# Patient Record
Sex: Female | Born: 1950 | Race: White | Hispanic: No | Marital: Married | Smoking: Former smoker
Health system: Southern US, Community
[De-identification: ages and names within clinical notes are randomized; demographics above are authoritative.]

## PROBLEM LIST (undated history)

## (undated) DIAGNOSIS — C801 Malignant (primary) neoplasm, unspecified: Secondary | ICD-10-CM

## (undated) DIAGNOSIS — T4145XA Adverse effect of unspecified anesthetic, initial encounter: Secondary | ICD-10-CM

## (undated) DIAGNOSIS — T8859XA Other complications of anesthesia, initial encounter: Secondary | ICD-10-CM

## (undated) DIAGNOSIS — F32A Depression, unspecified: Secondary | ICD-10-CM

## (undated) DIAGNOSIS — E039 Hypothyroidism, unspecified: Secondary | ICD-10-CM

## (undated) DIAGNOSIS — H04123 Dry eye syndrome of bilateral lacrimal glands: Secondary | ICD-10-CM

## (undated) DIAGNOSIS — F329 Major depressive disorder, single episode, unspecified: Secondary | ICD-10-CM

## (undated) DIAGNOSIS — Z87442 Personal history of urinary calculi: Secondary | ICD-10-CM

## (undated) DIAGNOSIS — G4730 Sleep apnea, unspecified: Secondary | ICD-10-CM

## (undated) DIAGNOSIS — J302 Other seasonal allergic rhinitis: Secondary | ICD-10-CM

## (undated) HISTORY — PX: COLONOSCOPY: SHX174

## (undated) HISTORY — PX: TONSILLECTOMY: SUR1361

## (undated) HISTORY — PX: ANAL FISSURE REPAIR: SHX2312

## (undated) HISTORY — PX: BLADDER STONE REMOVAL: SHX568

---

## 1999-09-03 ENCOUNTER — Other Ambulatory Visit: Admission: RE | Admit: 1999-09-03 | Discharge: 1999-09-03 | Payer: Self-pay | Admitting: Obstetrics and Gynecology

## 2000-10-24 ENCOUNTER — Encounter: Payer: Self-pay | Admitting: Obstetrics and Gynecology

## 2000-10-24 ENCOUNTER — Encounter: Admission: RE | Admit: 2000-10-24 | Discharge: 2000-10-24 | Payer: Self-pay | Admitting: Obstetrics and Gynecology

## 2000-11-17 ENCOUNTER — Other Ambulatory Visit: Admission: RE | Admit: 2000-11-17 | Discharge: 2000-11-17 | Payer: Self-pay | Admitting: Obstetrics and Gynecology

## 2001-10-26 ENCOUNTER — Encounter: Admission: RE | Admit: 2001-10-26 | Discharge: 2001-10-26 | Payer: Self-pay | Admitting: Obstetrics and Gynecology

## 2001-10-26 ENCOUNTER — Encounter: Payer: Self-pay | Admitting: Obstetrics and Gynecology

## 2001-11-23 ENCOUNTER — Other Ambulatory Visit: Admission: RE | Admit: 2001-11-23 | Discharge: 2001-11-23 | Payer: Self-pay | Admitting: Obstetrics and Gynecology

## 2002-08-30 ENCOUNTER — Encounter: Payer: Self-pay | Admitting: Geriatric Medicine

## 2002-08-30 ENCOUNTER — Encounter: Admission: RE | Admit: 2002-08-30 | Discharge: 2002-08-30 | Payer: Self-pay | Admitting: Geriatric Medicine

## 2002-09-02 ENCOUNTER — Encounter: Payer: Self-pay | Admitting: Geriatric Medicine

## 2002-09-02 ENCOUNTER — Encounter: Admission: RE | Admit: 2002-09-02 | Discharge: 2002-09-02 | Payer: Self-pay | Admitting: Geriatric Medicine

## 2002-09-23 ENCOUNTER — Ambulatory Visit (HOSPITAL_COMMUNITY): Admission: RE | Admit: 2002-09-23 | Discharge: 2002-09-23 | Payer: Self-pay | Admitting: Gastroenterology

## 2002-09-23 ENCOUNTER — Encounter (INDEPENDENT_AMBULATORY_CARE_PROVIDER_SITE_OTHER): Payer: Self-pay | Admitting: Specialist

## 2002-12-08 ENCOUNTER — Other Ambulatory Visit: Admission: RE | Admit: 2002-12-08 | Discharge: 2002-12-08 | Payer: Self-pay | Admitting: Obstetrics and Gynecology

## 2002-12-20 ENCOUNTER — Encounter: Admission: RE | Admit: 2002-12-20 | Discharge: 2002-12-20 | Payer: Self-pay | Admitting: Obstetrics and Gynecology

## 2002-12-20 ENCOUNTER — Encounter: Payer: Self-pay | Admitting: Obstetrics and Gynecology

## 2003-04-20 ENCOUNTER — Ambulatory Visit (HOSPITAL_BASED_OUTPATIENT_CLINIC_OR_DEPARTMENT_OTHER): Admission: RE | Admit: 2003-04-20 | Discharge: 2003-04-20 | Payer: Self-pay | Admitting: Geriatric Medicine

## 2003-12-13 ENCOUNTER — Other Ambulatory Visit: Admission: RE | Admit: 2003-12-13 | Discharge: 2003-12-13 | Payer: Self-pay | Admitting: Obstetrics and Gynecology

## 2004-01-10 ENCOUNTER — Ambulatory Visit (HOSPITAL_COMMUNITY): Admission: RE | Admit: 2004-01-10 | Discharge: 2004-01-10 | Payer: Self-pay | Admitting: Obstetrics and Gynecology

## 2005-01-14 ENCOUNTER — Ambulatory Visit (HOSPITAL_COMMUNITY): Admission: RE | Admit: 2005-01-14 | Discharge: 2005-01-14 | Payer: Self-pay | Admitting: Obstetrics and Gynecology

## 2005-01-21 ENCOUNTER — Other Ambulatory Visit: Admission: RE | Admit: 2005-01-21 | Discharge: 2005-01-21 | Payer: Self-pay | Admitting: Obstetrics and Gynecology

## 2005-03-14 ENCOUNTER — Ambulatory Visit (HOSPITAL_COMMUNITY): Admission: RE | Admit: 2005-03-14 | Discharge: 2005-03-14 | Payer: Self-pay | Admitting: Allergy

## 2005-05-22 ENCOUNTER — Other Ambulatory Visit: Admission: RE | Admit: 2005-05-22 | Discharge: 2005-05-22 | Payer: Self-pay | Admitting: Obstetrics and Gynecology

## 2005-09-16 ENCOUNTER — Other Ambulatory Visit: Admission: RE | Admit: 2005-09-16 | Discharge: 2005-09-16 | Payer: Self-pay | Admitting: Obstetrics and Gynecology

## 2005-12-16 ENCOUNTER — Other Ambulatory Visit: Admission: RE | Admit: 2005-12-16 | Discharge: 2005-12-16 | Payer: Self-pay | Admitting: Obstetrics and Gynecology

## 2006-02-11 ENCOUNTER — Other Ambulatory Visit: Admission: RE | Admit: 2006-02-11 | Discharge: 2006-02-11 | Payer: Self-pay | Admitting: Obstetrics and Gynecology

## 2006-03-19 ENCOUNTER — Ambulatory Visit (HOSPITAL_COMMUNITY): Admission: RE | Admit: 2006-03-19 | Discharge: 2006-03-19 | Payer: Self-pay | Admitting: Obstetrics and Gynecology

## 2007-02-25 ENCOUNTER — Other Ambulatory Visit: Admission: RE | Admit: 2007-02-25 | Discharge: 2007-02-25 | Payer: Self-pay | Admitting: Obstetrics and Gynecology

## 2007-04-17 ENCOUNTER — Ambulatory Visit (HOSPITAL_COMMUNITY): Admission: RE | Admit: 2007-04-17 | Discharge: 2007-04-17 | Payer: Self-pay | Admitting: Obstetrics and Gynecology

## 2007-06-01 ENCOUNTER — Encounter: Admission: RE | Admit: 2007-06-01 | Discharge: 2007-06-01 | Payer: Self-pay | Admitting: Geriatric Medicine

## 2007-06-09 ENCOUNTER — Encounter: Admission: RE | Admit: 2007-06-09 | Discharge: 2007-06-09 | Payer: Self-pay | Admitting: Geriatric Medicine

## 2007-06-11 ENCOUNTER — Emergency Department (HOSPITAL_COMMUNITY): Admission: EM | Admit: 2007-06-11 | Discharge: 2007-06-11 | Payer: Self-pay | Admitting: Emergency Medicine

## 2008-04-14 ENCOUNTER — Other Ambulatory Visit: Admission: RE | Admit: 2008-04-14 | Discharge: 2008-04-14 | Payer: Self-pay | Admitting: Obstetrics and Gynecology

## 2008-04-21 ENCOUNTER — Encounter: Admission: RE | Admit: 2008-04-21 | Discharge: 2008-04-21 | Payer: Self-pay | Admitting: Obstetrics and Gynecology

## 2010-02-06 HISTORY — PX: CARDIAC CATHETERIZATION: SHX172

## 2010-03-08 ENCOUNTER — Ambulatory Visit (HOSPITAL_COMMUNITY): Admission: RE | Admit: 2010-03-08 | Discharge: 2010-03-08 | Payer: Self-pay | Admitting: Cardiology

## 2010-07-02 ENCOUNTER — Encounter: Admission: RE | Admit: 2010-07-02 | Discharge: 2010-08-23 | Payer: Self-pay | Admitting: Family Medicine

## 2011-01-04 ENCOUNTER — Other Ambulatory Visit: Payer: Self-pay | Admitting: Obstetrics and Gynecology

## 2011-01-04 DIAGNOSIS — Z1239 Encounter for other screening for malignant neoplasm of breast: Secondary | ICD-10-CM

## 2011-01-10 ENCOUNTER — Ambulatory Visit
Admission: RE | Admit: 2011-01-10 | Discharge: 2011-01-10 | Disposition: A | Discharge status: Home / Self Care | Payer: Commercial Indemnity | Source: Ambulatory Visit | Attending: Obstetrics and Gynecology | Admitting: Obstetrics and Gynecology

## 2011-01-10 DIAGNOSIS — Z1239 Encounter for other screening for malignant neoplasm of breast: Secondary | ICD-10-CM

## 2011-02-15 ENCOUNTER — Other Ambulatory Visit (HOSPITAL_COMMUNITY)
Admission: RE | Admit: 2011-02-15 | Discharge: 2011-02-15 | Disposition: A | Discharge status: Home / Self Care | Payer: Commercial Indemnity | Source: Ambulatory Visit | Attending: Obstetrics and Gynecology | Admitting: Obstetrics and Gynecology

## 2011-02-15 ENCOUNTER — Other Ambulatory Visit: Payer: Self-pay | Admitting: Obstetrics and Gynecology

## 2011-02-15 DIAGNOSIS — Z01419 Encounter for gynecological examination (general) (routine) without abnormal findings: Secondary | ICD-10-CM | POA: Insufficient documentation

## 2011-04-26 NOTE — Op Note (Signed)
Donna Ponce, Donna Ponce                          ACCOUNT NO.:  [id number]   MEDICAL RECORD NO.:  [id number]                   PATIENT TYPE:  AMB   LOCATION:  ENDO                                 FACILITY:  [facility]   PHYSICIAN:  Danise Edge, M.D.                DATE OF BIRTH:  [birth year]   DATE OF PROCEDURE:  09/23/2002  DATE OF DISCHARGE:                                 OPERATIVE REPORT   PROCEDURE:  Colonoscopy and polypectomy.   INDICATIONS:  The patient is a 60 year old female born 09/03/1951.  When the  patient developed right lower quadrant abdominal pain, Hal T. Stoneking,  M.D., referred her to the [facility] for an air contrast  barium enema, which revealed a 1.5 cm polyp in the mid-ascending colon.  The  patient is scheduled to undergo colonoscopy with polypectomy today.   I discussed with the patient the complications associated with colonoscopy  and polypectomy, including a 15 per thousand risk of bleeding and four per  thousand risk of colon perforation requiring surgical repair.  The patient  has signed the operative permit.   ENDOSCOPIST:  Danise Edge, M.D.   PREMEDICATION:  Versed 10 mg, fentanyl 50 mcg.   ENDOSCOPE:  Olympus pediatric colonoscope.   DESCRIPTION OF PROCEDURE:  After obtaining informed consent, the patient was  placed in the left lateral decubitus position.  I administered intravenous  Versed and intravenous fentanyl to achieve conscious sedation for the  procedure.  The patient's blood pressure, oxygen saturation, and cardiac  rhythm were monitored throughout the procedure and documented in the medical  record.   Anal inspection was normal.  Digital rectal exam was normal.  The Olympus  pediatric video colonoscope was introduced into the rectum and advanced to  the cecum.  A normal-appearing ileocecal valve was intubated and the distal  ileum inspected.  Colonic preparation for the exam today was excellent.   Rectum  normal.   Sigmoid colon and descending colon:  At 35 cm from the anal verge a 2 mm  sessile polyp was removed with the hot biopsy forceps.   Splenic flexure normal.   Transverse colon normal.   Hepatic flexure normal.   Ascending colon:  From the mid-ascending colon a 1.5 cm pedunculated polyp  was removed with the electrocautery snare.   Cecum and ileocecal valve normal.   Distal ileum normal.   ASSESSMENT:  1. A 1.5 cm polyp was removed from the mid-ascending colon with the     electrocautery snare.  2. A 2 mm sessile polyp was removed from the distal sigmoid colon at 35 cm     from the anal verge with the hot biopsy forceps.  Danise Edge, M.D.    MJ/MEDQ  D:  09/23/2002  T:  09/23/2002  Job:  [id number]   cc:   Hal T. Stoneking, M.D.  [address] [place], [place] [address]  Fax: [phone]

## 2011-09-24 LAB — URINE MICROSCOPIC-ADD ON

## 2011-09-24 LAB — URINALYSIS, ROUTINE W REFLEX MICROSCOPIC
Glucose, UA: NEGATIVE
Nitrite: NEGATIVE
Protein, ur: NEGATIVE
pH: 7.5

## 2011-09-24 LAB — DIFFERENTIAL
Basophils Absolute: 0
Basophils Relative: 0
Lymphocytes Relative: 18
Lymphs Abs: 1.5
Monocytes Absolute: 0.7
Monocytes Relative: 8
Neutrophils Relative %: 74

## 2011-09-24 LAB — COMPREHENSIVE METABOLIC PANEL
AST: 20
Alkaline Phosphatase: 69
CO2: 29
Creatinine, Ser: 0.96
GFR calc Af Amer: >60
Glucose, Bld: 117 — ABNORMAL HIGH
Sodium: 140

## 2011-09-24 LAB — CBC
HCT: 38.1
Hemoglobin: 13.1
MCHC: 34.3
MCV: 92.8
WBC: 8.3

## 2011-09-24 LAB — LIPASE, BLOOD: Lipase: 28

## 2011-12-30 ENCOUNTER — Encounter (HOSPITAL_BASED_OUTPATIENT_CLINIC_OR_DEPARTMENT_OTHER): Payer: Self-pay | Admitting: *Deleted

## 2011-12-30 NOTE — Progress Notes (Signed)
No labs needed

## 2011-12-31 ENCOUNTER — Other Ambulatory Visit: Payer: Self-pay | Admitting: Orthopedic Surgery

## 2012-01-01 ENCOUNTER — Encounter (HOSPITAL_BASED_OUTPATIENT_CLINIC_OR_DEPARTMENT_OTHER)
Admission: RE | Disposition: A | Discharge status: Home / Self Care | Payer: Self-pay | Source: Ambulatory Visit | Attending: Orthopedic Surgery

## 2012-01-01 ENCOUNTER — Encounter (HOSPITAL_BASED_OUTPATIENT_CLINIC_OR_DEPARTMENT_OTHER): Payer: Self-pay | Admitting: *Deleted

## 2012-01-01 ENCOUNTER — Encounter (HOSPITAL_BASED_OUTPATIENT_CLINIC_OR_DEPARTMENT_OTHER): Payer: Self-pay | Admitting: Anesthesiology

## 2012-01-01 ENCOUNTER — Ambulatory Visit (HOSPITAL_BASED_OUTPATIENT_CLINIC_OR_DEPARTMENT_OTHER)
Admission: RE | Admit: 2012-01-01 | Discharge: 2012-01-01 | Disposition: A | Discharge status: Home / Self Care | Payer: BC Managed Care – PPO | Source: Ambulatory Visit | Attending: Orthopedic Surgery | Admitting: Orthopedic Surgery

## 2012-01-01 ENCOUNTER — Ambulatory Visit (HOSPITAL_BASED_OUTPATIENT_CLINIC_OR_DEPARTMENT_OTHER): Payer: BC Managed Care – PPO | Admitting: Anesthesiology

## 2012-01-01 DIAGNOSIS — F3289 Other specified depressive episodes: Secondary | ICD-10-CM | POA: Insufficient documentation

## 2012-01-01 DIAGNOSIS — M21619 Bunion of unspecified foot: Secondary | ICD-10-CM | POA: Insufficient documentation

## 2012-01-01 DIAGNOSIS — G4730 Sleep apnea, unspecified: Secondary | ICD-10-CM | POA: Insufficient documentation

## 2012-01-01 DIAGNOSIS — F329 Major depressive disorder, single episode, unspecified: Secondary | ICD-10-CM | POA: Insufficient documentation

## 2012-01-01 DIAGNOSIS — M21611 Bunion of right foot: Secondary | ICD-10-CM

## 2012-01-01 DIAGNOSIS — M2010 Hallux valgus (acquired), unspecified foot: Secondary | ICD-10-CM | POA: Insufficient documentation

## 2012-01-01 DIAGNOSIS — E039 Hypothyroidism, unspecified: Secondary | ICD-10-CM | POA: Insufficient documentation

## 2012-01-01 HISTORY — DX: Depression, unspecified: F32.A

## 2012-01-01 HISTORY — DX: Major depressive disorder, single episode, unspecified: F32.9

## 2012-01-01 HISTORY — DX: Other seasonal allergic rhinitis: J30.2

## 2012-01-01 HISTORY — DX: Sleep apnea, unspecified: G47.30

## 2012-01-01 HISTORY — DX: Hypothyroidism, unspecified: E03.9

## 2012-01-01 HISTORY — PX: BUNIONECTOMY: SHX129

## 2012-01-01 SURGERY — BUNIONECTOMY
Anesthesia: General | Site: Foot | Laterality: Bilateral | Wound class: Clean

## 2012-01-01 MED ORDER — POVIDONE-IODINE 7.5 % EX SOLN: Freq: Once | CUTANEOUS | Status: DC

## 2012-01-01 MED ORDER — PROPOFOL 10 MG/ML IV EMUL
INTRAVENOUS | Status: DC | PRN
  Administered 2012-01-01: 150 mg via INTRAVENOUS

## 2012-01-01 MED ORDER — MIDAZOLAM HCL 5 MG/5ML IJ SOLN
INTRAMUSCULAR | Status: DC | PRN
  Administered 2012-01-01: 2 mg via INTRAVENOUS

## 2012-01-01 MED ORDER — HYDROMORPHONE HCL PF 1 MG/ML IJ SOLN
0.2500 mg | INTRAMUSCULAR | Status: DC | PRN
  Administered 2012-01-01 (×2): 0.5 mg via INTRAVENOUS

## 2012-01-01 MED ORDER — OXYCODONE-ACETAMINOPHEN 5-325 MG PO TABS: 1.0000 | ORAL_TABLET | Freq: Four times a day (QID) | ORAL | Status: AC | PRN

## 2012-01-01 MED ORDER — CEFAZOLIN SODIUM-DEXTROSE 2-3 GM-% IV SOLR
2.0000 g | INTRAVENOUS | Status: AC
Start: 2012-01-01 — End: 2012-01-01
  Administered 2012-01-01: 2 g via INTRAVENOUS

## 2012-01-01 MED ORDER — ONDANSETRON HCL 4 MG/2ML IJ SOLN
INTRAMUSCULAR | Status: DC | PRN
  Administered 2012-01-01: 4 mg via INTRAVENOUS

## 2012-01-01 MED ORDER — MEPERIDINE HCL 25 MG/ML IJ SOLN: 6.2500 mg | INTRAMUSCULAR | Status: DC | PRN

## 2012-01-01 MED ORDER — FENTANYL CITRATE 0.05 MG/ML IJ SOLN
INTRAMUSCULAR | Status: DC | PRN
  Administered 2012-01-01 (×2): 25 ug via INTRAVENOUS
  Administered 2012-01-01: 100 ug via INTRAVENOUS
  Administered 2012-01-01: 25 ug via INTRAVENOUS

## 2012-01-01 MED ORDER — GLYCOPYRROLATE 0.2 MG/ML IJ SOLN
INTRAMUSCULAR | Status: DC | PRN
  Administered 2012-01-01: 0.2 mg via INTRAVENOUS

## 2012-01-01 MED ORDER — MORPHINE SULFATE 4 MG/ML IJ SOLN: 0.0500 mg/kg | INTRAMUSCULAR | Status: DC | PRN

## 2012-01-01 MED ORDER — 0.9 % SODIUM CHLORIDE (POUR BTL) OPTIME
TOPICAL | Status: DC | PRN
  Administered 2012-01-01: 1000 mL

## 2012-01-01 MED ORDER — ONDANSETRON HCL 4 MG/2ML IJ SOLN: 4.0000 mg | Freq: Once | INTRAMUSCULAR | Status: DC | PRN

## 2012-01-01 MED ORDER — BUPIVACAINE HCL (PF) 0.5 % IJ SOLN
INTRAMUSCULAR | Status: DC | PRN
  Administered 2012-01-01 (×2): 10 mL

## 2012-01-01 MED ORDER — LACTATED RINGERS IV SOLN
INTRAVENOUS | Status: DC
  Administered 2012-01-01 (×3): via INTRAVENOUS

## 2012-01-01 MED ORDER — LIDOCAINE HCL (CARDIAC) 20 MG/ML IV SOLN
INTRAVENOUS | Status: DC | PRN
  Administered 2012-01-01: 100 mg via INTRAVENOUS

## 2012-01-01 SURGICAL SUPPLY — 72 items
BANDAGE CONFORM 2  STR LF (GAUZE/BANDAGES/DRESSINGS) ×4 IMPLANT
BLADE AVERAGE 25X9 (BLADE) ×1 IMPLANT
BLADE OSC/SAG .038X5.5 CUT EDG (BLADE) IMPLANT
BLADE SURG 15 STRL LF DISP TIS (BLADE) ×1 IMPLANT
BLADE SURG 15 STRL SS (BLADE) ×4
BNDG CMPR 9X4 STRL LF SNTH (GAUZE/BANDAGES/DRESSINGS) ×1
BNDG CMPR MD 5X2 ELC HKLP STRL (GAUZE/BANDAGES/DRESSINGS) ×2
BNDG ELASTIC 2 VLCR STRL LF (GAUZE/BANDAGES/DRESSINGS) ×4 IMPLANT
BNDG ESMARK 4X9 LF (GAUZE/BANDAGES/DRESSINGS) ×1 IMPLANT
CANISTER SUCTION 1200CC (MISCELLANEOUS) ×1 IMPLANT
CLOTH BEACON ORANGE TIMEOUT ST (SAFETY) ×2 IMPLANT
COVER MAYO STAND STRL (DRAPES) ×1 IMPLANT
COVER SURGICAL LIGHT HANDLE (MISCELLANEOUS) ×1 IMPLANT
COVER TABLE BACK 60X90 (DRAPES) ×2 IMPLANT
CUFF TOURNIQUET SINGLE 18IN (TOURNIQUET CUFF) ×2 IMPLANT
DECANTER SPIKE VIAL GLASS SM (MISCELLANEOUS) IMPLANT
DRAIN PENROSE 1/4X12 LTX STRL (WOUND CARE) IMPLANT
DRAPE EXTREMITY T 121X128X90 (DRAPE) ×2 IMPLANT
DRAPE OEC MINIVIEW 54X84 (DRAPES) ×1 IMPLANT
DRAPE U 20/CS (DRAPES) ×3 IMPLANT
DRAPE U-SHAPE 47X51 STRL (DRAPES) ×2 IMPLANT
DRSG EMULSION OIL 3X3 NADH (GAUZE/BANDAGES/DRESSINGS) ×1 IMPLANT
DURAPREP 26ML APPLICATOR (WOUND CARE) ×3 IMPLANT
ELECT REM PT RETURN 9FT ADLT (ELECTROSURGICAL) ×2
ELECTRODE REM PT RTRN 9FT ADLT (ELECTROSURGICAL) IMPLANT
GAUZE XEROFORM 1X8 LF (GAUZE/BANDAGES/DRESSINGS) IMPLANT
GLOVE BIOGEL PI IND STRL 8 (GLOVE) ×2 IMPLANT
GLOVE BIOGEL PI INDICATOR 8 (GLOVE) ×2
GLOVE ECLIPSE 7.5 STRL STRAW (GLOVE) ×4 IMPLANT
GOWN BRE IMP PREV XXLGXLNG (GOWN DISPOSABLE) ×2 IMPLANT
GOWN PREVENTION PLUS XLARGE (GOWN DISPOSABLE) ×3 IMPLANT
GOWN PREVENTION PLUS XXLARGE (GOWN DISPOSABLE) ×1 IMPLANT
KWIRE 4.0 X .045IN (WIRE) IMPLANT
NDL 1/2 CIR CATGUT .05X1.09 (NEEDLE) IMPLANT
NDL ADDISON D1/2 CIR (NEEDLE) IMPLANT
NDL HYPO 25X1 1.5 SAFETY (NEEDLE) IMPLANT
NEEDLE 1/2 CIR CATGUT .05X1.09 (NEEDLE) IMPLANT
NEEDLE ADDISON D1/2 CIR (NEEDLE) IMPLANT
NEEDLE HYPO 25X1 1.5 SAFETY (NEEDLE) ×2 IMPLANT
NS IRRIG 1000ML POUR BTL (IV SOLUTION) ×2 IMPLANT
Orthosorb Resorbable Pin ×1 IMPLANT
PACK BASIN DAY SURGERY FS (CUSTOM PROCEDURE TRAY) ×2 IMPLANT
PAD CAST 3X4 CTTN HI CHSV (CAST SUPPLIES) IMPLANT
PADDING CAST ABS 4INX4YD NS (CAST SUPPLIES)
PADDING CAST ABS COTTON 4X4 ST (CAST SUPPLIES) ×1 IMPLANT
PADDING CAST COTTON 3X4 STRL (CAST SUPPLIES)
PADDING UNDERCAST 2  STERILE (CAST SUPPLIES) ×3 IMPLANT
PENCIL BUTTON HOLSTER BLD 10FT (ELECTRODE) ×1 IMPLANT
SHEET MEDIUM DRAPE 40X70 STRL (DRAPES) IMPLANT
SPONGE GAUZE 2X2 8PLY STRL LF (GAUZE/BANDAGES/DRESSINGS) ×1 IMPLANT
SPONGE GAUZE 4X4 12PLY (GAUZE/BANDAGES/DRESSINGS) ×2 IMPLANT
STOCKINETTE 4X48 STRL (DRAPES) ×3 IMPLANT
SUCTION FRAZIER TIP 10 FR DISP (SUCTIONS) ×1 IMPLANT
SUT ETHIBOND 3-0 V-5 (SUTURE) ×1 IMPLANT
SUT ETHILON 3 0 PS 1 (SUTURE) IMPLANT
SUT ETHILON 4 0 PS 2 18 (SUTURE) ×2 IMPLANT
SUT FIBERWIRE 2-0 18 17.9 3/8 (SUTURE) ×4
SUT VIC AB 0 CT3 27 (SUTURE) ×1 IMPLANT
SUT VIC AB 2-0 PS2 27 (SUTURE) IMPLANT
SUT VIC AB 2-0 SH 27 (SUTURE)
SUT VIC AB 2-0 SH 27XBRD (SUTURE) IMPLANT
SUT VIC AB 3-0 FS2 27 (SUTURE) ×1 IMPLANT
SUT VIC AB 4-0 P-3 18XBRD (SUTURE) ×1 IMPLANT
SUT VIC AB 4-0 P3 18 (SUTURE)
SUTURE FIBERWR 2-0 18 17.9 3/8 (SUTURE) IMPLANT
SYR BULB 3OZ (MISCELLANEOUS) ×1 IMPLANT
SYR CONTROL 10ML LL (SYRINGE) ×1 IMPLANT
TOWEL OR 17X24 6PK STRL BLUE (TOWEL DISPOSABLE) ×2 IMPLANT
TOWEL OR NON WOVEN STRL DISP B (DISPOSABLE) ×2 IMPLANT
TUBE CONNECTING 20X1/4 (TUBING) ×1 IMPLANT
UNDERPAD 30X30 INCONTINENT (UNDERPADS AND DIAPERS) ×3 IMPLANT
WATER STERILE IRR 1000ML POUR (IV SOLUTION) ×1 IMPLANT

## 2012-01-01 NOTE — Anesthesia Preprocedure Evaluation (Signed)
Anesthesia Evaluation  Patient identified by MRN, date of birth, ID band Patient awake    Reviewed: Allergy & Precautions, H&P , NPO status , Patient's Chart, lab work & pertinent test results  Airway Mallampati: I TM Distance: >3 FB Neck ROM: Full    Dental   Pulmonary sleep apnea and Continuous Positive Airway Pressure Ventilation ,          Cardiovascular     Neuro/Psych    GI/Hepatic   Endo/Other    Renal/GU      Musculoskeletal   Abdominal   Peds  Hematology   Anesthesia Other Findings   Reproductive/Obstetrics                           Anesthesia Physical Anesthesia Plan  ASA: III  Anesthesia Plan: General   Post-op Pain Management:    Induction: Intravenous  Airway Management Planned: LMA  Additional Equipment:   Intra-op Plan:   Post-operative Plan: Extubation in OR  Informed Consent: I have reviewed the patients History and Physical, chart, labs and discussed the procedure including the risks, benefits and alternatives for the proposed anesthesia with the patient or authorized representative who has indicated his/her understanding and acceptance.     Plan Discussed with: CRNA and Surgeon  Anesthesia Plan Comments: (Negative cardiac stress test 02/2010. Asymptomatic since.)        Anesthesia Quick Evaluation

## 2012-01-01 NOTE — H&P (Signed)
PREOPERATIVE H&P  Chief Complaint: bilateral bunions  HPI: Donna Ponce is a 61 y.o. female who presents for evaluation of bilateral bunions. It has been present for many years and has been worsening. She has failed conservative measures. Pain is rated as severe.  Past Medical History  Diagnosis Date  . Seasonal allergies   . Hypothyroidism   . Chronic kidney disease     bladder  . Asthma     allergies-sinus  . Depression     off meds  . Sleep apnea     uses cpap   Past Surgical History  Procedure Date  . Cardiac catheterization 3/11    negative  . Colonoscopy   . Anal fissure repair   . Bladder stone removal   . Tonsillectomy    History   Social History  . Marital Status: Married    Spouse Name: N/A    Number of Children: N/A  . Years of Education: N/A   Social History Main Topics  . Smoking status: Former Smoker    Quit date: 12/29/1982  . Smokeless tobacco: None  . Alcohol Use: No  . Drug Use: No  . Sexually Active:    Other Topics Concern  . None   Social History Narrative  . None   History reviewed. No pertinent family history. No Known Allergies Prior to Admission medications   Medication Sig Start Date End Date Taking? Authorizing Provider  albuterol (PROVENTIL HFA;VENTOLIN HFA) 108 (90 BASE) MCG/ACT inhaler Inhale 2 puffs into the lungs every 6 (six) hours as needed.   Yes Historical Provider, MD  azelastine (ASTELIN) 137 MCG/SPRAY nasal spray Place 1 spray into the nose 2 (two) times daily. Use in each nostril as directed   Yes Historical Provider, MD  fexofenadine (ALLEGRA) 180 MG tablet Take 180 mg by mouth daily.   Yes Historical Provider, MD  levothyroxine (SYNTHROID, LEVOTHROID) 100 MCG tablet Take 100 mcg by mouth daily.   Yes Historical Provider, MD  Multiple Vitamin (MULTIVITAMIN) capsule Take 1 capsule by mouth daily.   Yes Historical Provider, MD  mometasone (NASONEX) 50 MCG/ACT nasal spray Place 2 sprays into the nose daily.     Historical Provider, MD     Positive ROS: none  All other systems have been reviewed and were otherwise negative with the exception of those mentioned in the HPI and as above.  Physical Exam: Filed Vitals:   01/01/12 0717  BP: 130/92  Pulse: 68  Temp: 98.2 F (36.8 C)  Resp: 16    General: Alert, no acute distress Cardiovascular: No pedal edema Respiratory: No cyanosis, no use of accessory musculature GI: No organomegaly, abdomen is soft and non-tender Skin: No lesions in the area of chief complaint Neurologic: Sensation intact distally Psychiatric: Patient is competent for consent with normal mood and affect Lymphatic: No axillary or cervical lymphadenopathy  MUSCULOSKELETAL: bilateral hallux valgus with painful rom.  + ttp over med eminence  Assessment/Plan: bilateral bunions Plan for Procedure(s): BUNIONECTOMY distal chevron L@R   The risks benefits and alternatives were discussed with the patient including but not limited to the risks of nonoperative treatment, versus surgical intervention including infection, bleeding, nerve injury, malunion, nonunion, hardware prominence, hardware failure, need for hardware removal, blood clots, cardiopulmonary complications, morbidity, mortality, among others, and they were willing to proceed.  Predicted outcome is good, although there will be at least a six to nine month expected recovery.  Jakarie Pember L, MD 01/01/2012 8:38 AM

## 2012-01-01 NOTE — Anesthesia Postprocedure Evaluation (Signed)
Anesthesia Post Note  Patient: Donna Ponce  Procedure(s) Performed:  Arbutus Leas - Left foot surgery completed at 0955.  Right foot surgery started at 0948.  Anesthesia type: general  Patient location: PACU  Post pain: Pain level controlled  Post assessment: Patient's Cardiovascular Status Stable  Last Vitals:  Filed Vitals:   01/01/12 1100  BP: 128/77  Pulse: 81  Temp:   Resp: 9    Post vital signs: Reviewed and stable  Level of consciousness: sedated  Complications: No apparent anesthesia complications

## 2012-01-01 NOTE — Brief Op Note (Signed)
01/01/2012  10:45 AM  PATIENT:  Zenaida Niece  61 y.o. female  PRE-OPERATIVE DIAGNOSIS:  Bilateral Bunions  POST-OPERATIVE DIAGNOSIS:  Bilateral Bunions  PROCEDURE:  Procedure(s): BUNIONECTOMY  SURGEON:  Surgeon(s): Harvie Junior, MD  PHYSICIAN ASSISTANT:   ASSISTANTS: bethune   ANESTHESIA:   general  EBL:  Total I/O In: 1300 [I.V.:1300] Out: -   BLOOD ADMINISTERED:none  DRAINS: none   LOCAL MEDICATIONS USED:  MARCAINE 10CC to each foot  SPECIMEN:  No Specimen  DISPOSITION OF SPECIMEN:  N/A  COUNTS:  YES  TOURNIQUET:   Total Tourniquet Time Documented: Calf (Left) - 52 minutes Calf (Right) - 55 minutes  DICTATION: .Other Dictation: Dictation Number [id number]  PLAN OF CARE: Discharge to home after PACU  PATIENT DISPOSITION:  PACU - hemodynamically stable.   Delay start of Pharmacological VTE agent (>24hrs) due to surgical blood loss or risk of bleeding:  {YES/NO/NOT APPLICABLE:20182

## 2012-01-01 NOTE — Anesthesia Procedure Notes (Signed)
Procedure Name: LMA Insertion Date/Time: 01/01/2012 8:51 AM Performed by: Jearld Shines Pre-anesthesia Checklist: Patient identified, Emergency Drugs available, Suction available and Patient being monitored Patient Re-evaluated:Patient Re-evaluated prior to inductionOxygen Delivery Method: Circle System Utilized Preoxygenation: Pre-oxygenation with 100% oxygen Intubation Type: IV induction Ventilation: Mask ventilation without difficulty LMA: LMA inserted LMA Size: 4.0 Number of attempts: 1 Placement Confirmation: positive ETCO2 and breath sounds checked- equal and bilateral Tube secured with: Tape Dental Injury: Teeth and Oropharynx as per pre-operative assessment

## 2012-01-01 NOTE — Transfer of Care (Signed)
Immediate Anesthesia Transfer of Care Note  Patient: Donna Ponce  Procedure(s) Performed:  Arbutus Leas - Left foot surgery completed at 0955.  Right foot surgery started at 0948.  Patient Location: PACU  Anesthesia Type: General  Level of Consciousness: sedated  Airway & Oxygen Therapy: Patient Spontanous Breathing and Patient connected to face mask oxygen  Post-op Assessment: Report given to PACU RN and Post -op Vital signs reviewed and stable  Post vital signs: Reviewed and stable  Complications: No apparent anesthesia complications

## 2012-01-02 ENCOUNTER — Encounter (HOSPITAL_BASED_OUTPATIENT_CLINIC_OR_DEPARTMENT_OTHER): Payer: Self-pay | Admitting: Orthopedic Surgery

## 2012-01-02 NOTE — Op Note (Signed)
NAMECOPPER, Donna NO.:  [id number]  MEDICAL RECORD NO.:  [id number]  LOCATION:                                 FACILITY:  PHYSICIAN:  Harvie Junior, M.D.        DATE OF BIRTH:  DATE OF PROCEDURE:  01/01/2012 DATE OF DISCHARGE:                              OPERATIVE REPORT   PREOPERATIVE DIAGNOSIS:  Bilateral bunion deformity, left greater than right.  POSTOPERATIVE DIAGNOSIS:  Bilateral bunion deformity, left greater than right.  PRINCIPAL PROCEDURES: 1. Left foot distal chevron osteotomy. 2. Left foot distal soft tissue realignment for correction of hallux     valgus deformity. 3. Right foot distal chevron osteotomy. 4. Right foot distal soft tissue realignment for correction of hallux     valgus deformity.  SURGEON:  Harvie Junior, MD  ASSISTANT:  Marshia Ly, PA  ANESTHESIA:  General.  BRIEF HISTORY:  Donna Ponce is a 61 year old female with long history of having severe bilateral bunion deformity.  We talked to her in the office for a prolonged period of time about options.  She had had conservative care for greater than a year with activity modification, anti-inflammatory medication, and because of continued complaints of deformity and pain, we discussed surgical intervention.  We discussed the parameters for distal soft tissue realignment and distal chevron osteotomy, and we felt that this was a reasonable choice for her.  She brought up the idea of doing bilateral and we were not in love with the idea just relative to the fact that it would be bilateral procedure but she did want a piggyback to recovery.  She understood that she could be in a wheelchair for a period of time, certainly would need a walker and that would be obviously in significant pain with doing bilateral and because of this, she was well informed and decided to have bilateral procedure performed.  She was taken to the operating room for this procedure.  PROCEDURE:   The patient was taken to the operating room.  After adequate anesthesia was obtained with general anesthetic, the patient was placed supine on the operating table.  Both legs were then prepped and draped in usual sterile fashion.  Following this, the left leg was exsanguinated, blood pressure tourniquet was inflated to 250 mmHg. Following this, a midline incision was made along the shaft of the metatarsal down over the proximal phalanx, subcutaneous tissue down the level of the capsule care being taken to identify and protect the skin nerves, the superficial nerves and veins.  Once this was completed, a horizontal incision was made in the capsule of about 6 mm separation just proximal to the metatarsophalangeal joint and then a parallel incision was made.  These were wide at the top and the bottom, care being taken to protect the nerves when this was done.  Once this was completed, the dorsomedial extension was made on the metatarsal and the medial eminence was identified.  The medial eminence was then resected in line with the metatarsal.  Underneath the skin, we dissected over into the first web space and I carefully released the adductor and did a waffling of the lateral  capsule.  This allowed for the great toe to come into a corrected position.  Once this was done, under direct vision, we made an ink mark in the center of the head and then made at 60-degree angle proximal and distal cuts and then these cuts were done with a saw blade and then the chevron was moved over laterally and the shaft moved medially.  We got about 5 mm of correction here.  Once this was done, a bioabsorbable pin was used to lock the osteotomy in place.  Fluoro images were taken multiple to make sure that we had in fact moved the osteotomy sufficiently and that the bio pin was in adequate position. The bio pin was placed, and the lateral prominence of bone was then removed.  Following this, we took a look at the  toe position, excellent toe position was achieved, and we then did a capsular repair with 2-0 FiberWire interrupted sutures x5 holding the hallux in about a 5-degree overcorrected position.  We then irrigated this wound thoroughly, closed the skin with nylon interrupted sutures, and put a toe spica splint on here and a compressive dressing.  Once this was done, the tourniquet was let down, and attention was turned to the right foot.  The right foot was then exsanguinated, blood pressure tourniquet was inflated to 250 mmHg.  Following this, a midline incision was made along the metatarsophalangeal joint, subcutaneous tissues down the level of the capsule care being taken to identify and protect the skin nerves and veins.  Once this was completed, the capsular incisions were made about 2 mm distal to the metatarsophalangeal joint and a parallel incision was made from here and then these were wired at the top and bottom.  At this point, a dorsomedial extension was made along the metatarsal and this was resected back and then the metatarsal from the sulcus was cut in line with the metatarsal under fluoroscopic imaging.  Once this was done, attention was turned to the subcutaneous dissection into the first web space where the lateral capsule was waffled and the adductor was released.  Once this was done, attention was turned back to the lateral side, where an ink mark was made in the center of the head and proximal and distal extensions were made at 60-degree angle, and a saw blade was used to make a distal chevron osteotomy.  This was moved laterally and the shaft medially to get 5 mm of translation.  This was pinned in place with bioabsorbable pin under fluoroscopic imaging, and once that was completed, the medial bone was then removed in line with the metatarsal. Once this was done, the capsule was held in a corrected position, the toe in 5 degrees of varus and the 5 interrupted 2-0  FiberWire stitches were used to hold the capsule in a corrected position.  The dorsomedial extension was then closed with Vicryl as it was on the opposite side. Following this, the wound was irrigated, suctioned dry, and the skin was closed with interrupted and running nylon stitches.  Sterile compressive dressing was applied as well as the toe spica splint holding the toe in varus and the patient was then taken to the recovery room where she was noted to be in a satisfactory condition.  A 10 mL of Marcaine was placed into the both toes for postoperative analgesia.  Marshia Ly assisted throughout the case, critical in his performance.  Multiple fluoroscopic images were taken, interpreted by me, intraoperatively.  Harvie Junior, M.D.     Donna Ponce  D:  01/01/2012  T:  01/02/2012  Job:  [id number]

## 2012-02-18 ENCOUNTER — Other Ambulatory Visit: Payer: Self-pay | Admitting: Obstetrics and Gynecology

## 2012-02-18 DIAGNOSIS — Z1231 Encounter for screening mammogram for malignant neoplasm of breast: Secondary | ICD-10-CM

## 2012-02-28 ENCOUNTER — Ambulatory Visit: Payer: BC Managed Care – PPO

## 2012-03-06 ENCOUNTER — Ambulatory Visit: Payer: BC Managed Care – PPO

## 2012-03-17 ENCOUNTER — Other Ambulatory Visit: Payer: Self-pay | Admitting: Obstetrics and Gynecology

## 2012-03-17 ENCOUNTER — Other Ambulatory Visit (HOSPITAL_COMMUNITY)
Admission: RE | Admit: 2012-03-17 | Discharge: 2012-03-17 | Disposition: A | Discharge status: Home / Self Care | Payer: BC Managed Care – PPO | Source: Ambulatory Visit | Attending: Obstetrics and Gynecology | Admitting: Obstetrics and Gynecology

## 2012-03-17 DIAGNOSIS — Z01419 Encounter for gynecological examination (general) (routine) without abnormal findings: Secondary | ICD-10-CM | POA: Insufficient documentation

## 2012-06-08 ENCOUNTER — Other Ambulatory Visit (HOSPITAL_COMMUNITY): Payer: Self-pay | Admitting: Obstetrics and Gynecology

## 2012-06-08 DIAGNOSIS — Z1231 Encounter for screening mammogram for malignant neoplasm of breast: Secondary | ICD-10-CM

## 2012-06-10 ENCOUNTER — Ambulatory Visit (HOSPITAL_COMMUNITY)
Admission: RE | Admit: 2012-06-10 | Discharge: 2012-06-10 | Disposition: A | Discharge status: Home / Self Care | Payer: BC Managed Care – PPO | Source: Ambulatory Visit | Attending: Obstetrics and Gynecology | Admitting: Obstetrics and Gynecology

## 2012-06-10 DIAGNOSIS — Z1231 Encounter for screening mammogram for malignant neoplasm of breast: Secondary | ICD-10-CM | POA: Insufficient documentation

## 2012-06-16 ENCOUNTER — Other Ambulatory Visit: Payer: Self-pay | Admitting: Obstetrics and Gynecology

## 2012-06-16 DIAGNOSIS — R928 Other abnormal and inconclusive findings on diagnostic imaging of breast: Secondary | ICD-10-CM

## 2012-06-18 ENCOUNTER — Ambulatory Visit
Admission: RE | Admit: 2012-06-18 | Discharge: 2012-06-18 | Disposition: A | Discharge status: Home / Self Care | Payer: BC Managed Care – PPO | Source: Ambulatory Visit | Attending: Obstetrics and Gynecology | Admitting: Obstetrics and Gynecology

## 2012-06-18 ENCOUNTER — Other Ambulatory Visit: Payer: Self-pay | Admitting: Obstetrics and Gynecology

## 2012-06-18 DIAGNOSIS — R921 Mammographic calcification found on diagnostic imaging of breast: Secondary | ICD-10-CM

## 2012-06-18 DIAGNOSIS — R928 Other abnormal and inconclusive findings on diagnostic imaging of breast: Secondary | ICD-10-CM

## 2012-07-02 ENCOUNTER — Other Ambulatory Visit: Payer: Self-pay | Admitting: Dermatology

## 2013-02-02 ENCOUNTER — Other Ambulatory Visit: Payer: Self-pay | Admitting: Obstetrics and Gynecology

## 2013-02-02 DIAGNOSIS — R921 Mammographic calcification found on diagnostic imaging of breast: Secondary | ICD-10-CM

## 2013-02-23 ENCOUNTER — Ambulatory Visit
Admission: RE | Admit: 2013-02-23 | Discharge: 2013-02-23 | Disposition: A | Discharge status: Home / Self Care | Payer: BC Managed Care – PPO | Source: Ambulatory Visit | Attending: Obstetrics and Gynecology | Admitting: Obstetrics and Gynecology

## 2013-03-17 ENCOUNTER — Other Ambulatory Visit: Payer: Self-pay | Admitting: Obstetrics and Gynecology

## 2013-03-17 ENCOUNTER — Other Ambulatory Visit (HOSPITAL_COMMUNITY)
Admission: RE | Admit: 2013-03-17 | Discharge: 2013-03-17 | Disposition: A | Discharge status: Home / Self Care | Payer: BC Managed Care – PPO | Source: Ambulatory Visit | Attending: Obstetrics and Gynecology | Admitting: Obstetrics and Gynecology

## 2013-03-17 DIAGNOSIS — Z01419 Encounter for gynecological examination (general) (routine) without abnormal findings: Secondary | ICD-10-CM | POA: Insufficient documentation

## 2013-03-17 DIAGNOSIS — Z1151 Encounter for screening for human papillomavirus (HPV): Secondary | ICD-10-CM | POA: Insufficient documentation

## 2013-08-24 ENCOUNTER — Other Ambulatory Visit: Payer: Self-pay | Admitting: Obstetrics and Gynecology

## 2013-08-24 DIAGNOSIS — R921 Mammographic calcification found on diagnostic imaging of breast: Secondary | ICD-10-CM

## 2013-09-13 ENCOUNTER — Ambulatory Visit
Admission: RE | Admit: 2013-09-13 | Discharge: 2013-09-13 | Disposition: A | Discharge status: Home / Self Care | Payer: BC Managed Care – PPO | Source: Ambulatory Visit | Attending: Obstetrics and Gynecology | Admitting: Obstetrics and Gynecology

## 2013-09-13 DIAGNOSIS — R921 Mammographic calcification found on diagnostic imaging of breast: Secondary | ICD-10-CM

## 2013-11-24 ENCOUNTER — Other Ambulatory Visit: Payer: Self-pay | Admitting: Geriatric Medicine

## 2013-11-24 DIAGNOSIS — R109 Unspecified abdominal pain: Secondary | ICD-10-CM

## 2013-11-25 ENCOUNTER — Ambulatory Visit
Admission: RE | Admit: 2013-11-25 | Discharge: 2013-11-25 | Disposition: A | Discharge status: Home / Self Care | Payer: BC Managed Care – PPO | Source: Ambulatory Visit | Attending: Geriatric Medicine | Admitting: Geriatric Medicine

## 2013-11-25 DIAGNOSIS — R109 Unspecified abdominal pain: Secondary | ICD-10-CM

## 2014-01-24 ENCOUNTER — Other Ambulatory Visit: Payer: Self-pay | Admitting: Gastroenterology

## 2014-01-24 DIAGNOSIS — R1013 Epigastric pain: Secondary | ICD-10-CM

## 2014-02-04 ENCOUNTER — Other Ambulatory Visit: Payer: BC Managed Care – PPO

## 2014-02-08 ENCOUNTER — Ambulatory Visit
Admission: RE | Admit: 2014-02-08 | Discharge: 2014-02-08 | Disposition: A | Discharge status: Home / Self Care | Payer: BC Managed Care – PPO | Source: Ambulatory Visit | Attending: Gastroenterology | Admitting: Gastroenterology

## 2014-02-08 ENCOUNTER — Other Ambulatory Visit: Payer: BC Managed Care – PPO

## 2014-02-08 DIAGNOSIS — R1013 Epigastric pain: Secondary | ICD-10-CM

## 2014-02-10 ENCOUNTER — Other Ambulatory Visit: Payer: BC Managed Care – PPO

## 2014-03-10 ENCOUNTER — Other Ambulatory Visit: Payer: Self-pay | Admitting: Gastroenterology

## 2014-05-17 ENCOUNTER — Other Ambulatory Visit: Payer: Self-pay | Admitting: Obstetrics and Gynecology

## 2014-05-17 ENCOUNTER — Other Ambulatory Visit (HOSPITAL_COMMUNITY)
Admission: RE | Admit: 2014-05-17 | Discharge: 2014-05-17 | Disposition: A | Discharge status: Home / Self Care | Payer: BC Managed Care – PPO | Source: Ambulatory Visit | Attending: Obstetrics and Gynecology | Admitting: Obstetrics and Gynecology

## 2014-05-17 DIAGNOSIS — Z01419 Encounter for gynecological examination (general) (routine) without abnormal findings: Secondary | ICD-10-CM | POA: Insufficient documentation

## 2014-05-18 LAB — CYTOLOGY - PAP

## 2014-09-26 ENCOUNTER — Other Ambulatory Visit: Payer: Self-pay | Admitting: Obstetrics and Gynecology

## 2014-09-26 DIAGNOSIS — R921 Mammographic calcification found on diagnostic imaging of breast: Secondary | ICD-10-CM

## 2014-10-07 ENCOUNTER — Other Ambulatory Visit: Payer: Self-pay | Admitting: Dermatology

## 2014-10-12 ENCOUNTER — Ambulatory Visit
Admission: RE | Admit: 2014-10-12 | Discharge: 2014-10-12 | Disposition: A | Discharge status: Home / Self Care | Payer: 59 | Source: Ambulatory Visit | Attending: Obstetrics and Gynecology | Admitting: Obstetrics and Gynecology

## 2014-10-12 ENCOUNTER — Encounter (INDEPENDENT_AMBULATORY_CARE_PROVIDER_SITE_OTHER): Payer: Self-pay

## 2014-10-12 DIAGNOSIS — R921 Mammographic calcification found on diagnostic imaging of breast: Secondary | ICD-10-CM

## 2015-01-20 ENCOUNTER — Other Ambulatory Visit: Payer: Self-pay | Admitting: Geriatric Medicine

## 2015-01-20 DIAGNOSIS — R1084 Generalized abdominal pain: Secondary | ICD-10-CM

## 2015-01-23 ENCOUNTER — Other Ambulatory Visit: Payer: Self-pay

## 2015-01-25 ENCOUNTER — Inpatient Hospital Stay
Admission: RE | Admit: 2015-01-25 | Discharge: 2015-01-25 | Disposition: A | Discharge status: Home / Self Care | Payer: Self-pay | Source: Ambulatory Visit | Attending: Geriatric Medicine | Admitting: Geriatric Medicine

## 2015-01-25 ENCOUNTER — Other Ambulatory Visit: Payer: Self-pay

## 2015-01-27 ENCOUNTER — Ambulatory Visit
Admission: RE | Admit: 2015-01-27 | Discharge: 2015-01-27 | Disposition: A | Discharge status: Home / Self Care | Payer: Self-pay | Source: Ambulatory Visit | Attending: Geriatric Medicine | Admitting: Geriatric Medicine

## 2015-01-27 DIAGNOSIS — R1084 Generalized abdominal pain: Secondary | ICD-10-CM

## 2015-01-27 MED ORDER — IOHEXOL 300 MG/ML  SOLN
100.0000 mL | Freq: Once | INTRAMUSCULAR | Status: AC | PRN
  Administered 2015-01-27: 100 mL via INTRAVENOUS

## 2015-05-03 ENCOUNTER — Other Ambulatory Visit: Payer: Self-pay | Admitting: Gastroenterology

## 2015-05-12 ENCOUNTER — Encounter (HOSPITAL_COMMUNITY): Payer: Self-pay | Admitting: *Deleted

## 2015-05-17 ENCOUNTER — Encounter (HOSPITAL_COMMUNITY): Payer: Self-pay | Admitting: Anesthesiology

## 2015-05-17 NOTE — Anesthesia Preprocedure Evaluation (Signed)
Anesthesia Evaluation  Patient identified by MRN, date of birth, ID band Patient awake    Reviewed: Allergy & Precautions, NPO status , Patient's Chart, lab work & pertinent test results  History of Anesthesia Complications (+) history of anesthetic complications  Airway Mallampati: II  TM Distance: >3 FB Neck ROM: Full    Dental no notable dental hx.    Pulmonary asthma , sleep apnea , former smoker,  breath sounds clear to auscultation  Pulmonary exam normal       Cardiovascular negative cardio ROS Normal cardiovascular examRhythm:Regular Rate:Normal     Neuro/Psych PSYCHIATRIC DISORDERS Depression negative neurological ROS     GI/Hepatic negative GI ROS, Neg liver ROS,   Endo/Other  Hypothyroidism   Renal/GU negative Renal ROS  negative genitourinary   Musculoskeletal negative musculoskeletal ROS (+)   Abdominal   Peds negative pediatric ROS (+)  Hematology negative hematology ROS (+)   Anesthesia Other Findings   Reproductive/Obstetrics negative OB ROS                             Anesthesia Physical Anesthesia Plan  ASA: II  Anesthesia Plan: MAC   Post-op Pain Management:    Induction: Intravenous  Airway Management Planned: Natural Airway  Additional Equipment:   Intra-op Plan:   Post-operative Plan:   Informed Consent: I have reviewed the patients History and Physical, chart, labs and discussed the procedure including the risks, benefits and alternatives for the proposed anesthesia with the patient or authorized representative who has indicated his/her understanding and acceptance.   Dental advisory given  Plan Discussed with: CRNA  Anesthesia Plan Comments:         Anesthesia Quick Evaluation

## 2015-05-18 ENCOUNTER — Ambulatory Visit (HOSPITAL_COMMUNITY): Payer: 59 | Admitting: Anesthesiology

## 2015-05-18 ENCOUNTER — Encounter (HOSPITAL_COMMUNITY): Payer: Self-pay | Admitting: Anesthesiology

## 2015-05-18 ENCOUNTER — Ambulatory Visit (HOSPITAL_COMMUNITY)
Admission: RE | Admit: 2015-05-18 | Discharge: 2015-05-18 | Disposition: A | Discharge status: Home / Self Care | Payer: 59 | Source: Ambulatory Visit | Attending: Gastroenterology | Admitting: Gastroenterology

## 2015-05-18 ENCOUNTER — Encounter (HOSPITAL_COMMUNITY)
Admission: RE | Disposition: A | Discharge status: Home / Self Care | Payer: Self-pay | Source: Ambulatory Visit | Attending: Gastroenterology

## 2015-05-18 DIAGNOSIS — G4733 Obstructive sleep apnea (adult) (pediatric): Secondary | ICD-10-CM | POA: Insufficient documentation

## 2015-05-18 DIAGNOSIS — Z09 Encounter for follow-up examination after completed treatment for conditions other than malignant neoplasm: Secondary | ICD-10-CM | POA: Diagnosis present

## 2015-05-18 DIAGNOSIS — Z87891 Personal history of nicotine dependence: Secondary | ICD-10-CM | POA: Insufficient documentation

## 2015-05-18 DIAGNOSIS — J45909 Unspecified asthma, uncomplicated: Secondary | ICD-10-CM | POA: Insufficient documentation

## 2015-05-18 DIAGNOSIS — G8929 Other chronic pain: Secondary | ICD-10-CM | POA: Diagnosis not present

## 2015-05-18 DIAGNOSIS — D123 Benign neoplasm of transverse colon: Secondary | ICD-10-CM | POA: Insufficient documentation

## 2015-05-18 DIAGNOSIS — Z86010 Personal history of colonic polyps: Secondary | ICD-10-CM | POA: Diagnosis not present

## 2015-05-18 DIAGNOSIS — E039 Hypothyroidism, unspecified: Secondary | ICD-10-CM | POA: Insufficient documentation

## 2015-05-18 HISTORY — DX: Dry eye syndrome of bilateral lacrimal glands: H04.123

## 2015-05-18 HISTORY — PX: COLONOSCOPY WITH PROPOFOL: SHX5780

## 2015-05-18 HISTORY — DX: Other complications of anesthesia, initial encounter: T88.59XA

## 2015-05-18 HISTORY — DX: Personal history of urinary calculi: Z87.442

## 2015-05-18 HISTORY — DX: Malignant (primary) neoplasm, unspecified: C80.1

## 2015-05-18 HISTORY — DX: Adverse effect of unspecified anesthetic, initial encounter: T41.45XA

## 2015-05-18 SURGERY — COLONOSCOPY WITH PROPOFOL
Anesthesia: Monitor Anesthesia Care

## 2015-05-18 MED ORDER — SODIUM CHLORIDE 0.9 % IV SOLN: INTRAVENOUS | Status: DC

## 2015-05-18 MED ORDER — PROPOFOL INFUSION 10 MG/ML OPTIME
INTRAVENOUS | Status: DC | PRN
  Administered 2015-05-18: 75 ug/kg/min via INTRAVENOUS

## 2015-05-18 MED ORDER — LACTATED RINGERS IV SOLN
INTRAVENOUS | Status: DC | PRN
  Administered 2015-05-18: 08:00:00 via INTRAVENOUS

## 2015-05-18 MED ORDER — PROPOFOL 10 MG/ML IV BOLUS
INTRAVENOUS | Status: AC
  Filled 2015-05-18: qty 20

## 2015-05-18 MED ORDER — LACTATED RINGERS IV SOLN: INTRAVENOUS | Status: DC

## 2015-05-18 MED ORDER — PROPOFOL 10 MG/ML IV BOLUS
INTRAVENOUS | Status: DC | PRN
  Administered 2015-05-18: 30 mg via INTRAVENOUS
  Administered 2015-05-18 (×2): 20 mg via INTRAVENOUS

## 2015-05-18 SURGICAL SUPPLY — 22 items

## 2015-05-18 NOTE — H&P (Signed)
  Procedure: Surveillance colonoscopy. Chronic upper abdominal discomfort. 01/27/2015 normal CT scan abdomen-pelvis. 03/10/2014 normal esophagogastroduodenoscopy; gastric biopsies negative for H. pylori gastritis. 02/08/2014 normal barium upper GI x-ray series. 01/26/2013 normal abdominal ultrasound. 09/24/2010 colonoscopy performed with removal of a 5 mm adenomatous colon polyp. 06/09/2007 CT scan abdomen and pelvis showed urinary bladder stone. 06/01/2007 normal barium upper GI x-ray series. 09/18/2005 normal colonoscopy.  History: The patient is a 64 year old female born in [birth year]. In October 2011 she underwent a colonoscopy with removal of a 5 mm adenomatous colon polyp. She underwent a normal colonoscopy in October 2006. She has unexplained chronic upper abdominal discomfort.  The patient is scheduled to undergo a surveillance colonoscopy today.  Past medical history: Obstructive sleep apnea syndrome. Urinary bladder stone. Major depression. Hypothyroidism. Lumbar scoliosis. Allergic rhinitis. Tonsillectomy. Anal fistula repair in 1977.  Allergies: Dust, pollen, mildew, grass  Exam: The patient is alert and lying comfortably on the endoscopy stretcher. Abdomen is soft and nontender to palpation. Lungs are clear to auscultation. Cardiac exam reveals a regular rhythm.  Plan: Proceed with surveillance colonoscopy

## 2015-05-18 NOTE — Transfer of Care (Signed)
Immediate Anesthesia Transfer of Care Note  Patient: Donna Ponce  Procedure(s) Performed: Procedure(s): COLONOSCOPY WITH PROPOFOL (N/A)  Patient Location: PACU  Anesthesia Type:MAC  Level of Consciousness:  sedated, patient cooperative and responds to stimulation  Airway & Oxygen Therapy:Patient Spontanous Breathing and Patient connected to face mask oxgen  Post-op Assessment:  Report given to PACU RN and Post -op Vital signs reviewed and stable  Post vital signs:  Reviewed and stable  Last Vitals:  Filed Vitals:   05/18/15 0718  BP: 121/78  Pulse: 58  Temp: 36.9 C  Resp: 8    Complications: No apparent anesthesia complications

## 2015-05-18 NOTE — Discharge Instructions (Signed)

## 2015-05-18 NOTE — Anesthesia Postprocedure Evaluation (Signed)
  Anesthesia Post-op Note  Patient: Donna Ponce  Procedure(s) Performed: Procedure(s) (LRB): COLONOSCOPY WITH PROPOFOL (N/A)  Patient Location: PACU  Anesthesia Type: MAC  Level of Consciousness: awake and alert   Airway and Oxygen Therapy: Patient Spontanous Breathing  Post-op Pain: mild  Post-op Assessment: Post-op Vital signs reviewed, Patient's Cardiovascular Status Stable, Respiratory Function Stable, Patent Airway and No signs of Nausea or vomiting  Last Vitals:  Filed Vitals:   05/18/15 0807  BP: 112/64  Pulse: 55  Temp: 37.2 C  Resp: 17    Post-op Vital Signs: stable   Complications: No apparent anesthesia complications

## 2015-05-18 NOTE — Op Note (Signed)
Procedure: Surveillance colonoscopy. 09/24/2010 colonoscopy performed with removal of a 5 mm adenomatous colon polyp. 09/18/2005 normal colonoscopy.  Endoscopist: Earle Gell  Premedication: Propofol administered by anesthesia  Procedure: The patient was placed in the left lateral decubitus position. Anal inspection and digital rectal exam were normal. The Pentax pediatric colonoscope was introduced into the rectum and advanced to the cecum. A normal-appearing appendiceal orifice and cecal valve were identified. Colonic preparation for the exam today was good. Withdrawal time was 9 minutes  Rectum. Normal. Retroflexed view of the distal rectum was normal.  Sigmoid colon and descending colon. Normal  Splenic flexure. Normal  Transverse colon. From the mid transverse colon, a 5 mm sessile polyp was removed with the cold snare.  Hepatic flexure. Normal  Ascending colon. Normal  Cecum and ileocecal valve. Normal  Assessment: A small polyp was removed from the mid transverse colon; otherwise normal surveillance colonoscopy  Recommendation: If the polyp returns adenomatous pathologically, schedule repeat surveillance colonoscopy in 5 years. If the polyp returns hyperplastic pathologically, schedule repeat surveillance colonoscopy in 10 years

## 2015-05-19 ENCOUNTER — Encounter (HOSPITAL_COMMUNITY): Payer: Self-pay | Admitting: Gastroenterology

## 2015-05-23 ENCOUNTER — Other Ambulatory Visit (HOSPITAL_COMMUNITY)
Admission: RE | Admit: 2015-05-23 | Discharge: 2015-05-23 | Disposition: A | Discharge status: Home / Self Care | Payer: 59 | Source: Ambulatory Visit | Attending: Obstetrics and Gynecology | Admitting: Obstetrics and Gynecology

## 2015-05-23 ENCOUNTER — Other Ambulatory Visit: Payer: Self-pay | Admitting: Obstetrics and Gynecology

## 2015-05-23 DIAGNOSIS — Z01419 Encounter for gynecological examination (general) (routine) without abnormal findings: Secondary | ICD-10-CM | POA: Insufficient documentation

## 2015-05-24 LAB — CYTOLOGY - PAP

## 2015-08-15 ENCOUNTER — Other Ambulatory Visit: Payer: Self-pay | Admitting: Geriatric Medicine

## 2015-08-15 ENCOUNTER — Ambulatory Visit
Admission: RE | Admit: 2015-08-15 | Discharge: 2015-08-15 | Disposition: A | Discharge status: Home / Self Care | Payer: 59 | Source: Ambulatory Visit | Attending: Geriatric Medicine | Admitting: Geriatric Medicine

## 2015-08-15 DIAGNOSIS — R0789 Other chest pain: Secondary | ICD-10-CM

## 2016-01-15 ENCOUNTER — Other Ambulatory Visit: Payer: Self-pay

## 2016-01-15 DIAGNOSIS — Z1231 Encounter for screening mammogram for malignant neoplasm of breast: Secondary | ICD-10-CM

## 2016-01-26 ENCOUNTER — Ambulatory Visit
Admission: RE | Admit: 2016-01-26 | Discharge: 2016-01-26 | Disposition: A | Discharge status: Home / Self Care | Payer: BLUE CROSS/BLUE SHIELD | Source: Ambulatory Visit

## 2016-01-26 DIAGNOSIS — Z1231 Encounter for screening mammogram for malignant neoplasm of breast: Secondary | ICD-10-CM

## 2016-10-21 ENCOUNTER — Other Ambulatory Visit: Payer: Self-pay | Admitting: Obstetrics and Gynecology

## 2016-10-21 ENCOUNTER — Other Ambulatory Visit (HOSPITAL_COMMUNITY)
Admission: RE | Admit: 2016-10-21 | Discharge: 2016-10-21 | Disposition: A | Discharge status: Home / Self Care | Payer: Commercial Managed Care - HMO | Source: Ambulatory Visit | Attending: Obstetrics and Gynecology | Admitting: Obstetrics and Gynecology

## 2016-10-21 DIAGNOSIS — Z1151 Encounter for screening for human papillomavirus (HPV): Secondary | ICD-10-CM | POA: Diagnosis not present

## 2016-10-21 DIAGNOSIS — Z9189 Other specified personal risk factors, not elsewhere classified: Secondary | ICD-10-CM | POA: Diagnosis not present

## 2016-10-21 DIAGNOSIS — B009 Herpesviral infection, unspecified: Secondary | ICD-10-CM | POA: Diagnosis not present

## 2016-10-21 DIAGNOSIS — Z780 Asymptomatic menopausal state: Secondary | ICD-10-CM | POA: Diagnosis not present

## 2016-10-21 DIAGNOSIS — Z01419 Encounter for gynecological examination (general) (routine) without abnormal findings: Secondary | ICD-10-CM | POA: Diagnosis not present

## 2016-10-24 LAB — CYTOLOGY - PAP
Diagnosis: NEGATIVE
HPV (WINDOPATH): NOT DETECTED

## 2016-10-25 DIAGNOSIS — Z23 Encounter for immunization: Secondary | ICD-10-CM | POA: Diagnosis not present

## 2016-10-25 DIAGNOSIS — M545 Low back pain: Secondary | ICD-10-CM | POA: Diagnosis not present

## 2016-10-25 DIAGNOSIS — F331 Major depressive disorder, recurrent, moderate: Secondary | ICD-10-CM | POA: Diagnosis not present

## 2016-11-05 DIAGNOSIS — R05 Cough: Secondary | ICD-10-CM | POA: Diagnosis not present

## 2016-11-05 DIAGNOSIS — J3081 Allergic rhinitis due to animal (cat) (dog) hair and dander: Secondary | ICD-10-CM | POA: Diagnosis not present

## 2016-11-05 DIAGNOSIS — J301 Allergic rhinitis due to pollen: Secondary | ICD-10-CM | POA: Diagnosis not present

## 2016-11-05 DIAGNOSIS — J3089 Other allergic rhinitis: Secondary | ICD-10-CM | POA: Diagnosis not present

## 2016-11-08 DIAGNOSIS — J301 Allergic rhinitis due to pollen: Secondary | ICD-10-CM | POA: Diagnosis not present

## 2016-11-08 DIAGNOSIS — J3081 Allergic rhinitis due to animal (cat) (dog) hair and dander: Secondary | ICD-10-CM | POA: Diagnosis not present

## 2016-11-08 DIAGNOSIS — J3089 Other allergic rhinitis: Secondary | ICD-10-CM | POA: Diagnosis not present

## 2016-11-11 DIAGNOSIS — M8588 Other specified disorders of bone density and structure, other site: Secondary | ICD-10-CM | POA: Diagnosis not present

## 2016-11-11 DIAGNOSIS — Z780 Asymptomatic menopausal state: Secondary | ICD-10-CM | POA: Diagnosis not present

## 2016-12-05 DIAGNOSIS — J301 Allergic rhinitis due to pollen: Secondary | ICD-10-CM | POA: Diagnosis not present

## 2016-12-05 DIAGNOSIS — J3089 Other allergic rhinitis: Secondary | ICD-10-CM | POA: Diagnosis not present

## 2016-12-05 DIAGNOSIS — J3081 Allergic rhinitis due to animal (cat) (dog) hair and dander: Secondary | ICD-10-CM | POA: Diagnosis not present

## 2016-12-13 DIAGNOSIS — J301 Allergic rhinitis due to pollen: Secondary | ICD-10-CM | POA: Diagnosis not present

## 2016-12-13 DIAGNOSIS — J3081 Allergic rhinitis due to animal (cat) (dog) hair and dander: Secondary | ICD-10-CM | POA: Diagnosis not present

## 2016-12-13 DIAGNOSIS — J3089 Other allergic rhinitis: Secondary | ICD-10-CM | POA: Diagnosis not present

## 2016-12-18 DIAGNOSIS — J301 Allergic rhinitis due to pollen: Secondary | ICD-10-CM | POA: Diagnosis not present

## 2016-12-18 DIAGNOSIS — J3081 Allergic rhinitis due to animal (cat) (dog) hair and dander: Secondary | ICD-10-CM | POA: Diagnosis not present

## 2016-12-18 DIAGNOSIS — J3089 Other allergic rhinitis: Secondary | ICD-10-CM | POA: Diagnosis not present

## 2017-01-01 DIAGNOSIS — J301 Allergic rhinitis due to pollen: Secondary | ICD-10-CM | POA: Diagnosis not present

## 2017-01-01 DIAGNOSIS — J3081 Allergic rhinitis due to animal (cat) (dog) hair and dander: Secondary | ICD-10-CM | POA: Diagnosis not present

## 2017-01-01 DIAGNOSIS — J3089 Other allergic rhinitis: Secondary | ICD-10-CM | POA: Diagnosis not present

## 2017-01-15 DIAGNOSIS — J3081 Allergic rhinitis due to animal (cat) (dog) hair and dander: Secondary | ICD-10-CM | POA: Diagnosis not present

## 2017-01-15 DIAGNOSIS — J3089 Other allergic rhinitis: Secondary | ICD-10-CM | POA: Diagnosis not present

## 2017-01-15 DIAGNOSIS — J301 Allergic rhinitis due to pollen: Secondary | ICD-10-CM | POA: Diagnosis not present

## 2017-02-05 DIAGNOSIS — J3081 Allergic rhinitis due to animal (cat) (dog) hair and dander: Secondary | ICD-10-CM | POA: Diagnosis not present

## 2017-02-05 DIAGNOSIS — J3089 Other allergic rhinitis: Secondary | ICD-10-CM | POA: Diagnosis not present

## 2017-02-05 DIAGNOSIS — J301 Allergic rhinitis due to pollen: Secondary | ICD-10-CM | POA: Diagnosis not present

## 2017-02-14 DIAGNOSIS — J3089 Other allergic rhinitis: Secondary | ICD-10-CM | POA: Diagnosis not present

## 2017-02-14 DIAGNOSIS — J301 Allergic rhinitis due to pollen: Secondary | ICD-10-CM | POA: Diagnosis not present

## 2017-02-14 DIAGNOSIS — J3081 Allergic rhinitis due to animal (cat) (dog) hair and dander: Secondary | ICD-10-CM | POA: Diagnosis not present

## 2017-02-26 DIAGNOSIS — J3081 Allergic rhinitis due to animal (cat) (dog) hair and dander: Secondary | ICD-10-CM | POA: Diagnosis not present

## 2017-02-26 DIAGNOSIS — J301 Allergic rhinitis due to pollen: Secondary | ICD-10-CM | POA: Diagnosis not present

## 2017-02-26 DIAGNOSIS — J3089 Other allergic rhinitis: Secondary | ICD-10-CM | POA: Diagnosis not present

## 2017-03-03 ENCOUNTER — Other Ambulatory Visit: Payer: Self-pay | Admitting: Geriatric Medicine

## 2017-03-03 ENCOUNTER — Ambulatory Visit
Admission: RE | Admit: 2017-03-03 | Discharge: 2017-03-03 | Disposition: A | Discharge status: Home / Self Care | Payer: Medicare HMO | Source: Ambulatory Visit | Attending: Geriatric Medicine | Admitting: Geriatric Medicine

## 2017-03-03 DIAGNOSIS — G8929 Other chronic pain: Secondary | ICD-10-CM

## 2017-03-03 DIAGNOSIS — M542 Cervicalgia: Secondary | ICD-10-CM | POA: Diagnosis not present

## 2017-03-03 DIAGNOSIS — M546 Pain in thoracic spine: Secondary | ICD-10-CM | POA: Diagnosis not present

## 2017-03-05 DIAGNOSIS — M9905 Segmental and somatic dysfunction of pelvic region: Secondary | ICD-10-CM | POA: Diagnosis not present

## 2017-03-05 DIAGNOSIS — M5431 Sciatica, right side: Secondary | ICD-10-CM | POA: Diagnosis not present

## 2017-03-05 DIAGNOSIS — J301 Allergic rhinitis due to pollen: Secondary | ICD-10-CM | POA: Diagnosis not present

## 2017-03-05 DIAGNOSIS — J3089 Other allergic rhinitis: Secondary | ICD-10-CM | POA: Diagnosis not present

## 2017-03-05 DIAGNOSIS — M9902 Segmental and somatic dysfunction of thoracic region: Secondary | ICD-10-CM | POA: Diagnosis not present

## 2017-03-05 DIAGNOSIS — M5134 Other intervertebral disc degeneration, thoracic region: Secondary | ICD-10-CM | POA: Diagnosis not present

## 2017-03-05 DIAGNOSIS — M9903 Segmental and somatic dysfunction of lumbar region: Secondary | ICD-10-CM | POA: Diagnosis not present

## 2017-03-05 DIAGNOSIS — M5137 Other intervertebral disc degeneration, lumbosacral region: Secondary | ICD-10-CM | POA: Diagnosis not present

## 2017-03-05 DIAGNOSIS — J3081 Allergic rhinitis due to animal (cat) (dog) hair and dander: Secondary | ICD-10-CM | POA: Diagnosis not present

## 2017-03-10 DIAGNOSIS — M5134 Other intervertebral disc degeneration, thoracic region: Secondary | ICD-10-CM | POA: Diagnosis not present

## 2017-03-10 DIAGNOSIS — M9902 Segmental and somatic dysfunction of thoracic region: Secondary | ICD-10-CM | POA: Diagnosis not present

## 2017-03-10 DIAGNOSIS — M5137 Other intervertebral disc degeneration, lumbosacral region: Secondary | ICD-10-CM | POA: Diagnosis not present

## 2017-03-10 DIAGNOSIS — M5431 Sciatica, right side: Secondary | ICD-10-CM | POA: Diagnosis not present

## 2017-03-10 DIAGNOSIS — M9905 Segmental and somatic dysfunction of pelvic region: Secondary | ICD-10-CM | POA: Diagnosis not present

## 2017-03-10 DIAGNOSIS — M9903 Segmental and somatic dysfunction of lumbar region: Secondary | ICD-10-CM | POA: Diagnosis not present

## 2017-03-13 DIAGNOSIS — M5134 Other intervertebral disc degeneration, thoracic region: Secondary | ICD-10-CM | POA: Diagnosis not present

## 2017-03-13 DIAGNOSIS — M9905 Segmental and somatic dysfunction of pelvic region: Secondary | ICD-10-CM | POA: Diagnosis not present

## 2017-03-13 DIAGNOSIS — M5431 Sciatica, right side: Secondary | ICD-10-CM | POA: Diagnosis not present

## 2017-03-13 DIAGNOSIS — M9902 Segmental and somatic dysfunction of thoracic region: Secondary | ICD-10-CM | POA: Diagnosis not present

## 2017-03-13 DIAGNOSIS — M9903 Segmental and somatic dysfunction of lumbar region: Secondary | ICD-10-CM | POA: Diagnosis not present

## 2017-03-13 DIAGNOSIS — M5137 Other intervertebral disc degeneration, lumbosacral region: Secondary | ICD-10-CM | POA: Diagnosis not present

## 2017-03-25 ENCOUNTER — Other Ambulatory Visit: Payer: Self-pay | Admitting: Obstetrics and Gynecology

## 2017-03-25 DIAGNOSIS — Z1231 Encounter for screening mammogram for malignant neoplasm of breast: Secondary | ICD-10-CM

## 2017-03-27 DIAGNOSIS — M5431 Sciatica, right side: Secondary | ICD-10-CM | POA: Diagnosis not present

## 2017-03-27 DIAGNOSIS — M9902 Segmental and somatic dysfunction of thoracic region: Secondary | ICD-10-CM | POA: Diagnosis not present

## 2017-03-27 DIAGNOSIS — M9905 Segmental and somatic dysfunction of pelvic region: Secondary | ICD-10-CM | POA: Diagnosis not present

## 2017-03-27 DIAGNOSIS — A6920 Lyme disease, unspecified: Secondary | ICD-10-CM | POA: Diagnosis not present

## 2017-03-27 DIAGNOSIS — M9903 Segmental and somatic dysfunction of lumbar region: Secondary | ICD-10-CM | POA: Diagnosis not present

## 2017-03-27 DIAGNOSIS — M5134 Other intervertebral disc degeneration, thoracic region: Secondary | ICD-10-CM | POA: Diagnosis not present

## 2017-03-27 DIAGNOSIS — M5137 Other intervertebral disc degeneration, lumbosacral region: Secondary | ICD-10-CM | POA: Diagnosis not present

## 2017-03-28 DIAGNOSIS — J3089 Other allergic rhinitis: Secondary | ICD-10-CM | POA: Diagnosis not present

## 2017-03-28 DIAGNOSIS — J301 Allergic rhinitis due to pollen: Secondary | ICD-10-CM | POA: Diagnosis not present

## 2017-03-28 DIAGNOSIS — J3081 Allergic rhinitis due to animal (cat) (dog) hair and dander: Secondary | ICD-10-CM | POA: Diagnosis not present

## 2017-04-01 DIAGNOSIS — J3081 Allergic rhinitis due to animal (cat) (dog) hair and dander: Secondary | ICD-10-CM | POA: Diagnosis not present

## 2017-04-01 DIAGNOSIS — J3089 Other allergic rhinitis: Secondary | ICD-10-CM | POA: Diagnosis not present

## 2017-04-01 DIAGNOSIS — J301 Allergic rhinitis due to pollen: Secondary | ICD-10-CM | POA: Diagnosis not present

## 2017-04-02 DIAGNOSIS — M9903 Segmental and somatic dysfunction of lumbar region: Secondary | ICD-10-CM | POA: Diagnosis not present

## 2017-04-02 DIAGNOSIS — M5137 Other intervertebral disc degeneration, lumbosacral region: Secondary | ICD-10-CM | POA: Diagnosis not present

## 2017-04-02 DIAGNOSIS — M9905 Segmental and somatic dysfunction of pelvic region: Secondary | ICD-10-CM | POA: Diagnosis not present

## 2017-04-02 DIAGNOSIS — M9902 Segmental and somatic dysfunction of thoracic region: Secondary | ICD-10-CM | POA: Diagnosis not present

## 2017-04-02 DIAGNOSIS — M5431 Sciatica, right side: Secondary | ICD-10-CM | POA: Diagnosis not present

## 2017-04-02 DIAGNOSIS — M5134 Other intervertebral disc degeneration, thoracic region: Secondary | ICD-10-CM | POA: Diagnosis not present

## 2017-04-09 DIAGNOSIS — M5431 Sciatica, right side: Secondary | ICD-10-CM | POA: Diagnosis not present

## 2017-04-09 DIAGNOSIS — J3081 Allergic rhinitis due to animal (cat) (dog) hair and dander: Secondary | ICD-10-CM | POA: Diagnosis not present

## 2017-04-09 DIAGNOSIS — M9902 Segmental and somatic dysfunction of thoracic region: Secondary | ICD-10-CM | POA: Diagnosis not present

## 2017-04-09 DIAGNOSIS — M9905 Segmental and somatic dysfunction of pelvic region: Secondary | ICD-10-CM | POA: Diagnosis not present

## 2017-04-09 DIAGNOSIS — J301 Allergic rhinitis due to pollen: Secondary | ICD-10-CM | POA: Diagnosis not present

## 2017-04-09 DIAGNOSIS — M5134 Other intervertebral disc degeneration, thoracic region: Secondary | ICD-10-CM | POA: Diagnosis not present

## 2017-04-09 DIAGNOSIS — M9903 Segmental and somatic dysfunction of lumbar region: Secondary | ICD-10-CM | POA: Diagnosis not present

## 2017-04-09 DIAGNOSIS — J3089 Other allergic rhinitis: Secondary | ICD-10-CM | POA: Diagnosis not present

## 2017-04-09 DIAGNOSIS — M5137 Other intervertebral disc degeneration, lumbosacral region: Secondary | ICD-10-CM | POA: Diagnosis not present

## 2017-04-17 DIAGNOSIS — J3081 Allergic rhinitis due to animal (cat) (dog) hair and dander: Secondary | ICD-10-CM | POA: Diagnosis not present

## 2017-04-17 DIAGNOSIS — J3089 Other allergic rhinitis: Secondary | ICD-10-CM | POA: Diagnosis not present

## 2017-04-17 DIAGNOSIS — J301 Allergic rhinitis due to pollen: Secondary | ICD-10-CM | POA: Diagnosis not present

## 2017-04-22 ENCOUNTER — Ambulatory Visit: Payer: Medicare HMO

## 2017-04-25 ENCOUNTER — Ambulatory Visit
Admission: RE | Admit: 2017-04-25 | Discharge: 2017-04-25 | Disposition: A | Discharge status: Home / Self Care | Payer: Medicare HMO | Source: Ambulatory Visit | Attending: Obstetrics and Gynecology | Admitting: Obstetrics and Gynecology

## 2017-04-25 DIAGNOSIS — Z1231 Encounter for screening mammogram for malignant neoplasm of breast: Secondary | ICD-10-CM

## 2017-05-09 DIAGNOSIS — E039 Hypothyroidism, unspecified: Secondary | ICD-10-CM | POA: Diagnosis not present

## 2017-05-09 DIAGNOSIS — Z23 Encounter for immunization: Secondary | ICD-10-CM | POA: Diagnosis not present

## 2017-05-09 DIAGNOSIS — Z0000 Encounter for general adult medical examination without abnormal findings: Secondary | ICD-10-CM | POA: Diagnosis not present

## 2017-05-09 DIAGNOSIS — Z79899 Other long term (current) drug therapy: Secondary | ICD-10-CM | POA: Diagnosis not present

## 2017-05-09 DIAGNOSIS — J3081 Allergic rhinitis due to animal (cat) (dog) hair and dander: Secondary | ICD-10-CM | POA: Diagnosis not present

## 2017-05-09 DIAGNOSIS — J301 Allergic rhinitis due to pollen: Secondary | ICD-10-CM | POA: Diagnosis not present

## 2017-05-09 DIAGNOSIS — Z136 Encounter for screening for cardiovascular disorders: Secondary | ICD-10-CM | POA: Diagnosis not present

## 2017-05-09 DIAGNOSIS — J3089 Other allergic rhinitis: Secondary | ICD-10-CM | POA: Diagnosis not present

## 2017-05-09 DIAGNOSIS — F325 Major depressive disorder, single episode, in full remission: Secondary | ICD-10-CM | POA: Diagnosis not present

## 2017-05-16 DIAGNOSIS — J3089 Other allergic rhinitis: Secondary | ICD-10-CM | POA: Diagnosis not present

## 2017-05-16 DIAGNOSIS — J301 Allergic rhinitis due to pollen: Secondary | ICD-10-CM | POA: Diagnosis not present

## 2017-05-16 DIAGNOSIS — J3081 Allergic rhinitis due to animal (cat) (dog) hair and dander: Secondary | ICD-10-CM | POA: Diagnosis not present

## 2017-05-19 DIAGNOSIS — J3081 Allergic rhinitis due to animal (cat) (dog) hair and dander: Secondary | ICD-10-CM | POA: Diagnosis not present

## 2017-05-19 DIAGNOSIS — J301 Allergic rhinitis due to pollen: Secondary | ICD-10-CM | POA: Diagnosis not present

## 2017-05-19 DIAGNOSIS — J3089 Other allergic rhinitis: Secondary | ICD-10-CM | POA: Diagnosis not present

## 2017-06-02 DIAGNOSIS — H2513 Age-related nuclear cataract, bilateral: Secondary | ICD-10-CM | POA: Diagnosis not present

## 2017-06-02 DIAGNOSIS — D1801 Hemangioma of skin and subcutaneous tissue: Secondary | ICD-10-CM | POA: Diagnosis not present

## 2017-06-02 DIAGNOSIS — H25013 Cortical age-related cataract, bilateral: Secondary | ICD-10-CM | POA: Diagnosis not present

## 2017-06-02 DIAGNOSIS — H04123 Dry eye syndrome of bilateral lacrimal glands: Secondary | ICD-10-CM | POA: Diagnosis not present

## 2017-06-06 DIAGNOSIS — J3089 Other allergic rhinitis: Secondary | ICD-10-CM | POA: Diagnosis not present

## 2017-06-06 DIAGNOSIS — J3081 Allergic rhinitis due to animal (cat) (dog) hair and dander: Secondary | ICD-10-CM | POA: Diagnosis not present

## 2017-06-06 DIAGNOSIS — J301 Allergic rhinitis due to pollen: Secondary | ICD-10-CM | POA: Diagnosis not present

## 2017-06-19 DIAGNOSIS — R300 Dysuria: Secondary | ICD-10-CM | POA: Diagnosis not present

## 2017-06-23 DIAGNOSIS — J3081 Allergic rhinitis due to animal (cat) (dog) hair and dander: Secondary | ICD-10-CM | POA: Diagnosis not present

## 2017-06-23 DIAGNOSIS — J301 Allergic rhinitis due to pollen: Secondary | ICD-10-CM | POA: Diagnosis not present

## 2017-06-23 DIAGNOSIS — J3089 Other allergic rhinitis: Secondary | ICD-10-CM | POA: Diagnosis not present

## 2017-07-24 DIAGNOSIS — F325 Major depressive disorder, single episode, in full remission: Secondary | ICD-10-CM | POA: Diagnosis not present

## 2017-07-24 DIAGNOSIS — E039 Hypothyroidism, unspecified: Secondary | ICD-10-CM | POA: Diagnosis not present

## 2017-07-24 DIAGNOSIS — F331 Major depressive disorder, recurrent, moderate: Secondary | ICD-10-CM | POA: Diagnosis not present

## 2017-08-25 DIAGNOSIS — J3089 Other allergic rhinitis: Secondary | ICD-10-CM | POA: Diagnosis not present

## 2017-08-25 DIAGNOSIS — J3081 Allergic rhinitis due to animal (cat) (dog) hair and dander: Secondary | ICD-10-CM | POA: Diagnosis not present

## 2017-08-25 DIAGNOSIS — J301 Allergic rhinitis due to pollen: Secondary | ICD-10-CM | POA: Diagnosis not present

## 2017-08-28 DIAGNOSIS — J3089 Other allergic rhinitis: Secondary | ICD-10-CM | POA: Diagnosis not present

## 2017-08-28 DIAGNOSIS — J301 Allergic rhinitis due to pollen: Secondary | ICD-10-CM | POA: Diagnosis not present

## 2017-08-28 DIAGNOSIS — J3081 Allergic rhinitis due to animal (cat) (dog) hair and dander: Secondary | ICD-10-CM | POA: Diagnosis not present

## 2017-08-29 DIAGNOSIS — M79671 Pain in right foot: Secondary | ICD-10-CM | POA: Diagnosis not present

## 2017-08-29 DIAGNOSIS — M7752 Other enthesopathy of left foot: Secondary | ICD-10-CM | POA: Diagnosis not present

## 2017-08-29 DIAGNOSIS — M2011 Hallux valgus (acquired), right foot: Secondary | ICD-10-CM | POA: Diagnosis not present

## 2017-08-29 DIAGNOSIS — M7751 Other enthesopathy of right foot: Secondary | ICD-10-CM | POA: Diagnosis not present

## 2017-08-29 DIAGNOSIS — M2012 Hallux valgus (acquired), left foot: Secondary | ICD-10-CM | POA: Diagnosis not present

## 2017-08-29 DIAGNOSIS — M79672 Pain in left foot: Secondary | ICD-10-CM | POA: Diagnosis not present

## 2017-08-29 DIAGNOSIS — B079 Viral wart, unspecified: Secondary | ICD-10-CM | POA: Diagnosis not present

## 2017-09-26 DIAGNOSIS — J301 Allergic rhinitis due to pollen: Secondary | ICD-10-CM | POA: Diagnosis not present

## 2017-09-26 DIAGNOSIS — J3089 Other allergic rhinitis: Secondary | ICD-10-CM | POA: Diagnosis not present

## 2017-09-26 DIAGNOSIS — J3081 Allergic rhinitis due to animal (cat) (dog) hair and dander: Secondary | ICD-10-CM | POA: Diagnosis not present

## 2017-10-03 DIAGNOSIS — Z23 Encounter for immunization: Secondary | ICD-10-CM | POA: Diagnosis not present

## 2017-10-07 DIAGNOSIS — E039 Hypothyroidism, unspecified: Secondary | ICD-10-CM | POA: Diagnosis not present

## 2017-10-07 DIAGNOSIS — F331 Major depressive disorder, recurrent, moderate: Secondary | ICD-10-CM | POA: Diagnosis not present

## 2017-10-07 DIAGNOSIS — F325 Major depressive disorder, single episode, in full remission: Secondary | ICD-10-CM | POA: Diagnosis not present

## 2017-11-14 DIAGNOSIS — J301 Allergic rhinitis due to pollen: Secondary | ICD-10-CM | POA: Diagnosis not present

## 2017-11-14 DIAGNOSIS — J3081 Allergic rhinitis due to animal (cat) (dog) hair and dander: Secondary | ICD-10-CM | POA: Diagnosis not present

## 2017-11-14 DIAGNOSIS — F325 Major depressive disorder, single episode, in full remission: Secondary | ICD-10-CM | POA: Diagnosis not present

## 2017-11-14 DIAGNOSIS — J3089 Other allergic rhinitis: Secondary | ICD-10-CM | POA: Diagnosis not present

## 2017-11-26 DIAGNOSIS — J3081 Allergic rhinitis due to animal (cat) (dog) hair and dander: Secondary | ICD-10-CM | POA: Diagnosis not present

## 2017-11-26 DIAGNOSIS — J3089 Other allergic rhinitis: Secondary | ICD-10-CM | POA: Diagnosis not present

## 2017-11-26 DIAGNOSIS — J301 Allergic rhinitis due to pollen: Secondary | ICD-10-CM | POA: Diagnosis not present

## 2017-12-22 DIAGNOSIS — J3089 Other allergic rhinitis: Secondary | ICD-10-CM | POA: Diagnosis not present

## 2017-12-22 DIAGNOSIS — Z8619 Personal history of other infectious and parasitic diseases: Secondary | ICD-10-CM | POA: Diagnosis not present

## 2017-12-22 DIAGNOSIS — Z9189 Other specified personal risk factors, not elsewhere classified: Secondary | ICD-10-CM | POA: Diagnosis not present

## 2017-12-22 DIAGNOSIS — J301 Allergic rhinitis due to pollen: Secondary | ICD-10-CM | POA: Diagnosis not present

## 2017-12-22 DIAGNOSIS — J3081 Allergic rhinitis due to animal (cat) (dog) hair and dander: Secondary | ICD-10-CM | POA: Diagnosis not present

## 2017-12-22 DIAGNOSIS — N951 Menopausal and female climacteric states: Secondary | ICD-10-CM | POA: Diagnosis not present

## 2017-12-24 DIAGNOSIS — J3081 Allergic rhinitis due to animal (cat) (dog) hair and dander: Secondary | ICD-10-CM | POA: Diagnosis not present

## 2017-12-24 DIAGNOSIS — J3089 Other allergic rhinitis: Secondary | ICD-10-CM | POA: Diagnosis not present

## 2017-12-24 DIAGNOSIS — J301 Allergic rhinitis due to pollen: Secondary | ICD-10-CM | POA: Diagnosis not present

## 2018-02-10 DIAGNOSIS — J301 Allergic rhinitis due to pollen: Secondary | ICD-10-CM | POA: Diagnosis not present

## 2018-02-10 DIAGNOSIS — J3081 Allergic rhinitis due to animal (cat) (dog) hair and dander: Secondary | ICD-10-CM | POA: Diagnosis not present

## 2018-02-10 DIAGNOSIS — J3089 Other allergic rhinitis: Secondary | ICD-10-CM | POA: Diagnosis not present

## 2018-02-10 DIAGNOSIS — D1801 Hemangioma of skin and subcutaneous tissue: Secondary | ICD-10-CM | POA: Diagnosis not present

## 2018-02-10 DIAGNOSIS — H1013 Acute atopic conjunctivitis, bilateral: Secondary | ICD-10-CM | POA: Diagnosis not present

## 2018-02-10 DIAGNOSIS — H01009 Unspecified blepharitis unspecified eye, unspecified eyelid: Secondary | ICD-10-CM | POA: Diagnosis not present

## 2018-02-10 DIAGNOSIS — H04123 Dry eye syndrome of bilateral lacrimal glands: Secondary | ICD-10-CM | POA: Diagnosis not present

## 2018-03-18 DIAGNOSIS — M9902 Segmental and somatic dysfunction of thoracic region: Secondary | ICD-10-CM | POA: Diagnosis not present

## 2018-03-18 DIAGNOSIS — M5431 Sciatica, right side: Secondary | ICD-10-CM | POA: Diagnosis not present

## 2018-03-18 DIAGNOSIS — M9905 Segmental and somatic dysfunction of pelvic region: Secondary | ICD-10-CM | POA: Diagnosis not present

## 2018-03-18 DIAGNOSIS — M5134 Other intervertebral disc degeneration, thoracic region: Secondary | ICD-10-CM | POA: Diagnosis not present

## 2018-03-18 DIAGNOSIS — M5137 Other intervertebral disc degeneration, lumbosacral region: Secondary | ICD-10-CM | POA: Diagnosis not present

## 2018-03-18 DIAGNOSIS — M9903 Segmental and somatic dysfunction of lumbar region: Secondary | ICD-10-CM | POA: Diagnosis not present

## 2018-04-06 DIAGNOSIS — M9902 Segmental and somatic dysfunction of thoracic region: Secondary | ICD-10-CM | POA: Diagnosis not present

## 2018-04-06 DIAGNOSIS — M9905 Segmental and somatic dysfunction of pelvic region: Secondary | ICD-10-CM | POA: Diagnosis not present

## 2018-04-06 DIAGNOSIS — M5137 Other intervertebral disc degeneration, lumbosacral region: Secondary | ICD-10-CM | POA: Diagnosis not present

## 2018-04-06 DIAGNOSIS — M9903 Segmental and somatic dysfunction of lumbar region: Secondary | ICD-10-CM | POA: Diagnosis not present

## 2018-04-06 DIAGNOSIS — M5134 Other intervertebral disc degeneration, thoracic region: Secondary | ICD-10-CM | POA: Diagnosis not present

## 2018-04-06 DIAGNOSIS — M5431 Sciatica, right side: Secondary | ICD-10-CM | POA: Diagnosis not present

## 2018-04-20 DIAGNOSIS — E039 Hypothyroidism, unspecified: Secondary | ICD-10-CM | POA: Diagnosis not present

## 2018-04-20 DIAGNOSIS — F325 Major depressive disorder, single episode, in full remission: Secondary | ICD-10-CM | POA: Diagnosis not present

## 2018-04-20 DIAGNOSIS — F331 Major depressive disorder, recurrent, moderate: Secondary | ICD-10-CM | POA: Diagnosis not present

## 2018-04-26 IMAGING — CR DG CERVICAL SPINE 2 OR 3 VIEWS
3 series · 3 of 3 positions shown · non-contrast
Comparison: None

CLINICAL DATA: Chronic posterior neck pain, no injury

EXAM:
CERVICAL SPINE - 2-3 VIEW

[w c-spine lat]
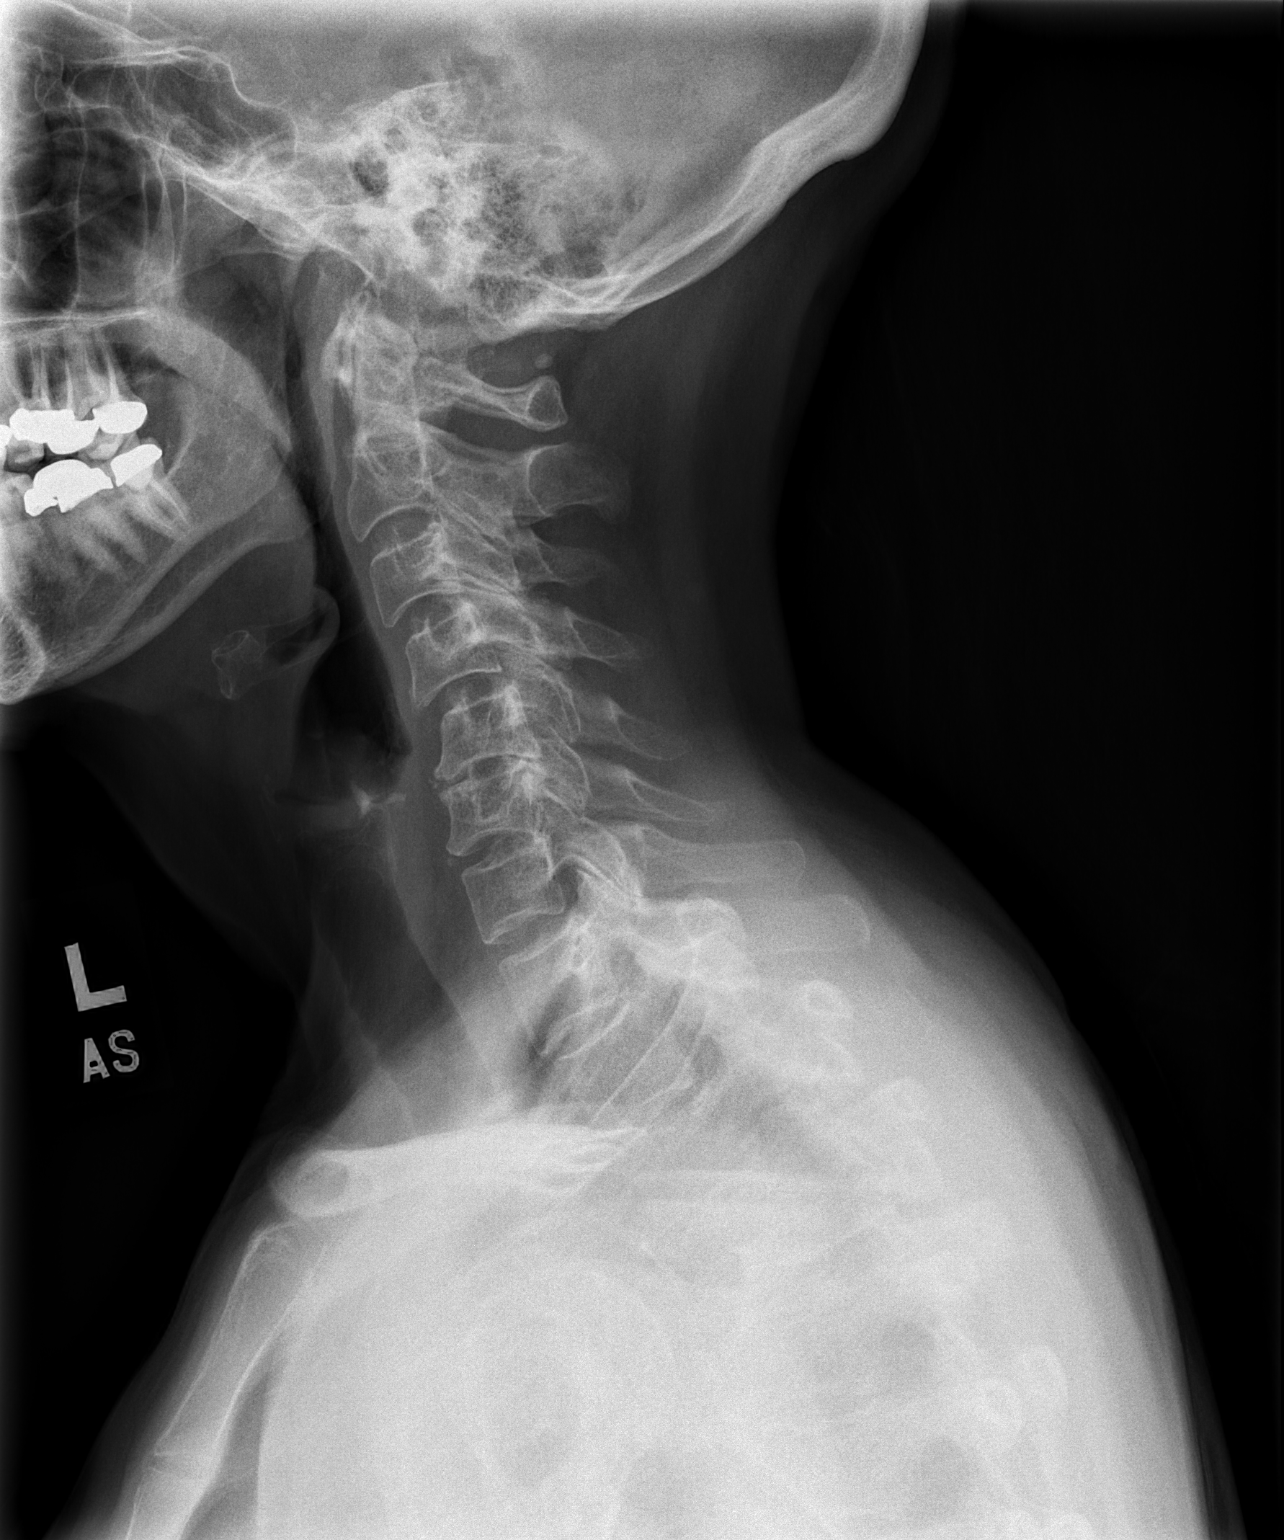

[w c-spine a.p. *]
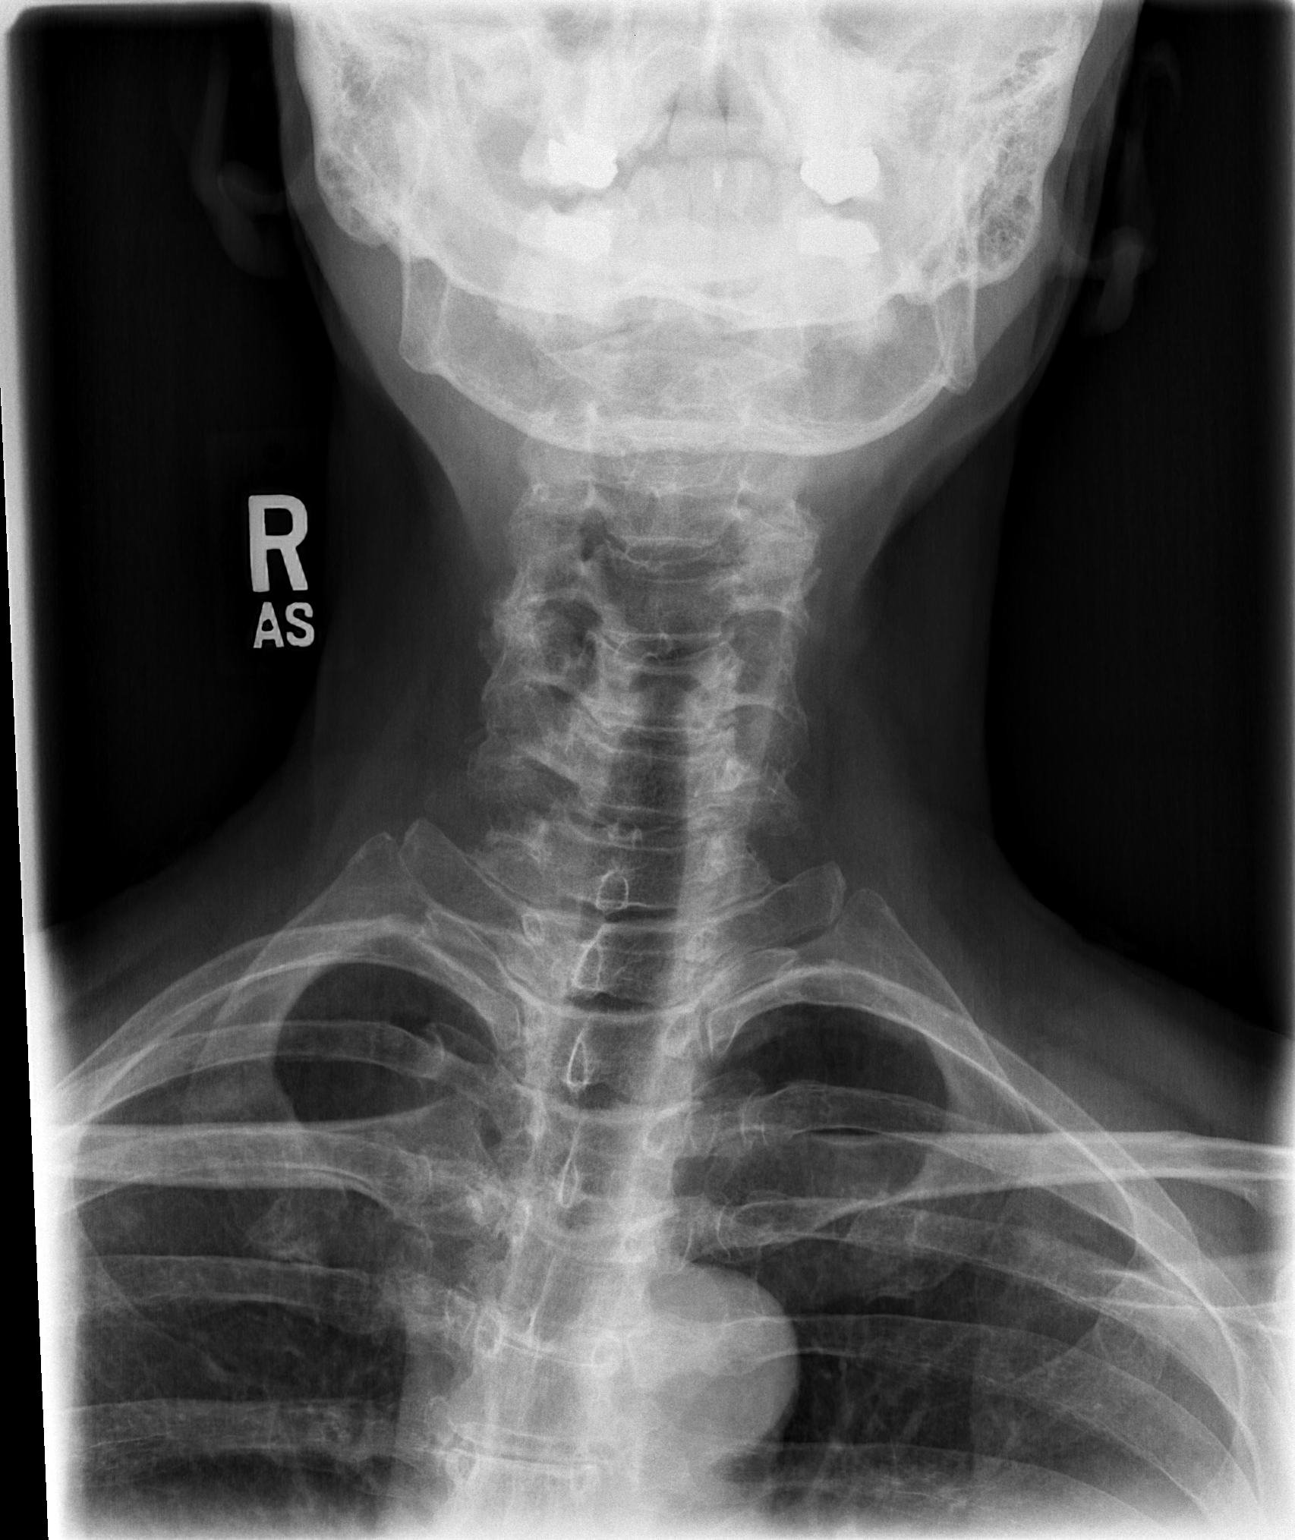

[w c-spine odontoid *]
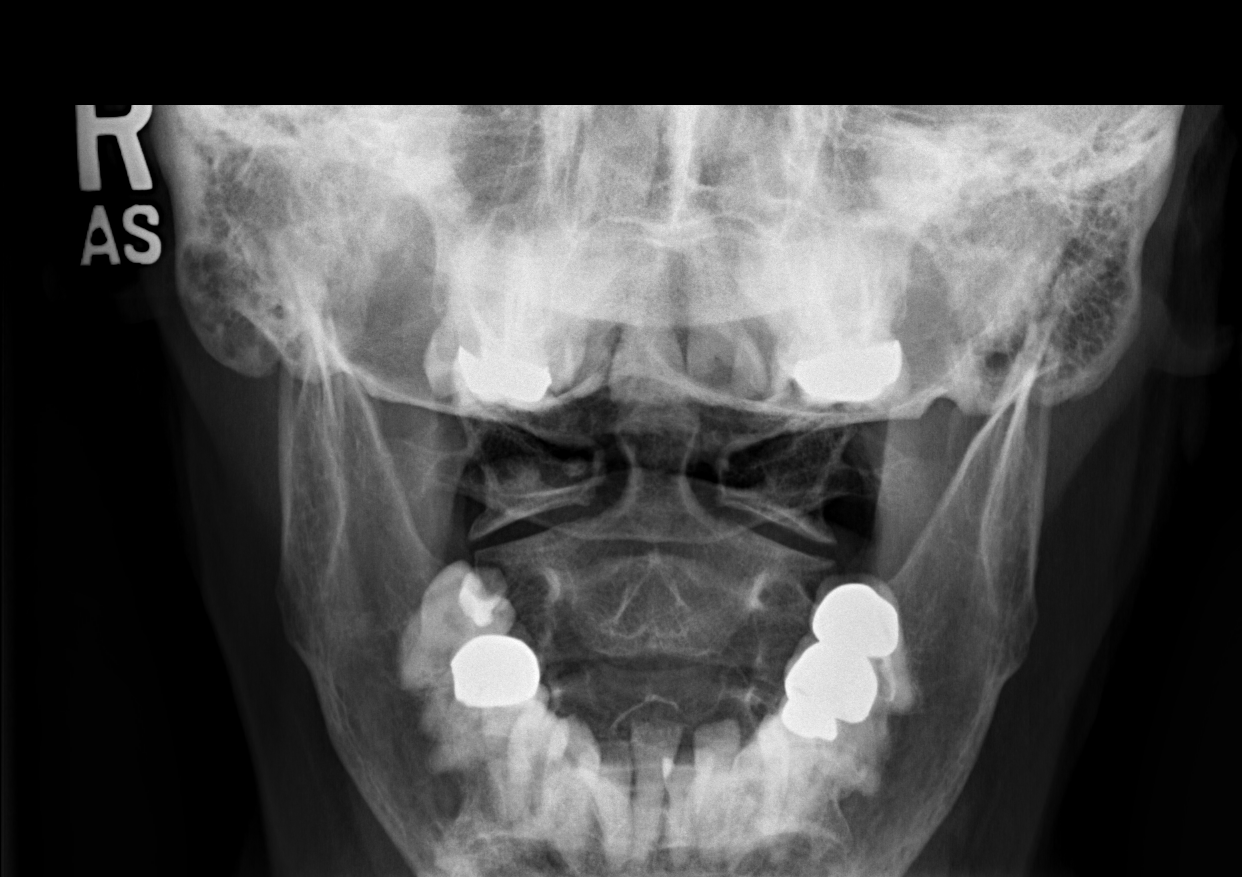

[3 of 3 positions shown; findings below may reference images not displayed]

FINDINGS: Osseous demineralization.

Prevertebral soft tissues normal thickness.

Reversal cervical lordosis which could be related to muscle spasm.

Disc space narrowing with endplate spur formation C5-C6.

Scattered facet degenerative changes.

Vertebral body heights maintained without fracture or subluxation.

Lung apices clear.

C1-C2 alignment normal.
IMPRESSION: Degenerative disc disease changes cervical spine at C5-C6 with
diffuse facet degenerative changes.

Reversal of cervical lordosis question muscle spasm.

## 2018-04-26 IMAGING — CR DG THORACIC SPINE 3V
3 series · 3 of 3 positions shown · non-contrast
Comparison: Chest radiograph 08/15/2015

CLINICAL DATA: Chronic mid thoracic spine pain, no injury

EXAM:
THORACIC SPINE - 3 VIEWS

[t t-spine a.p.]
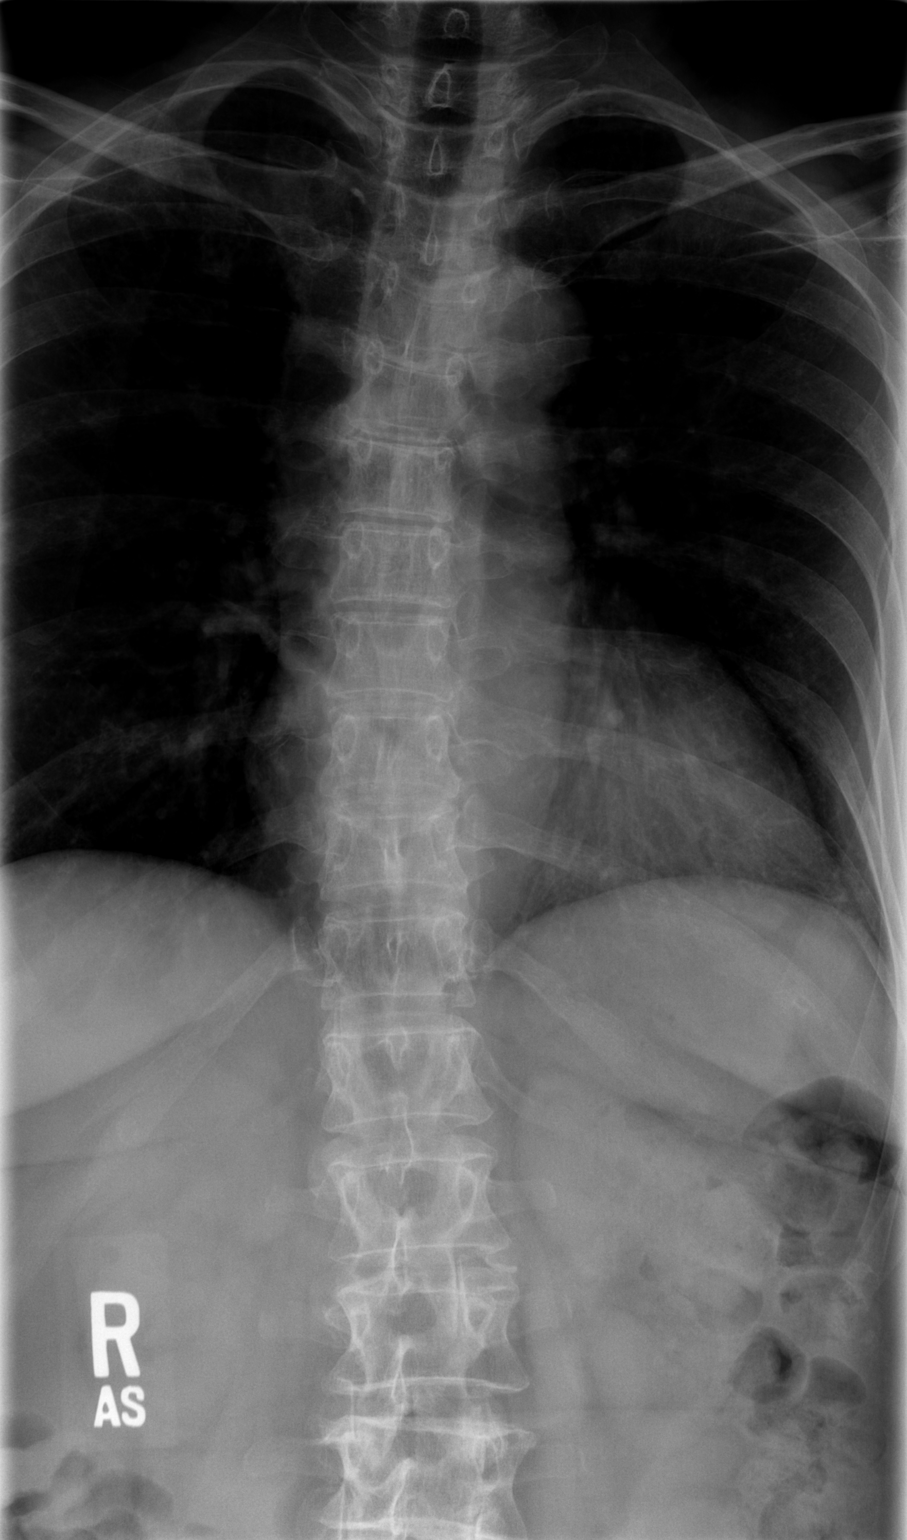

[t t-spine lat *]
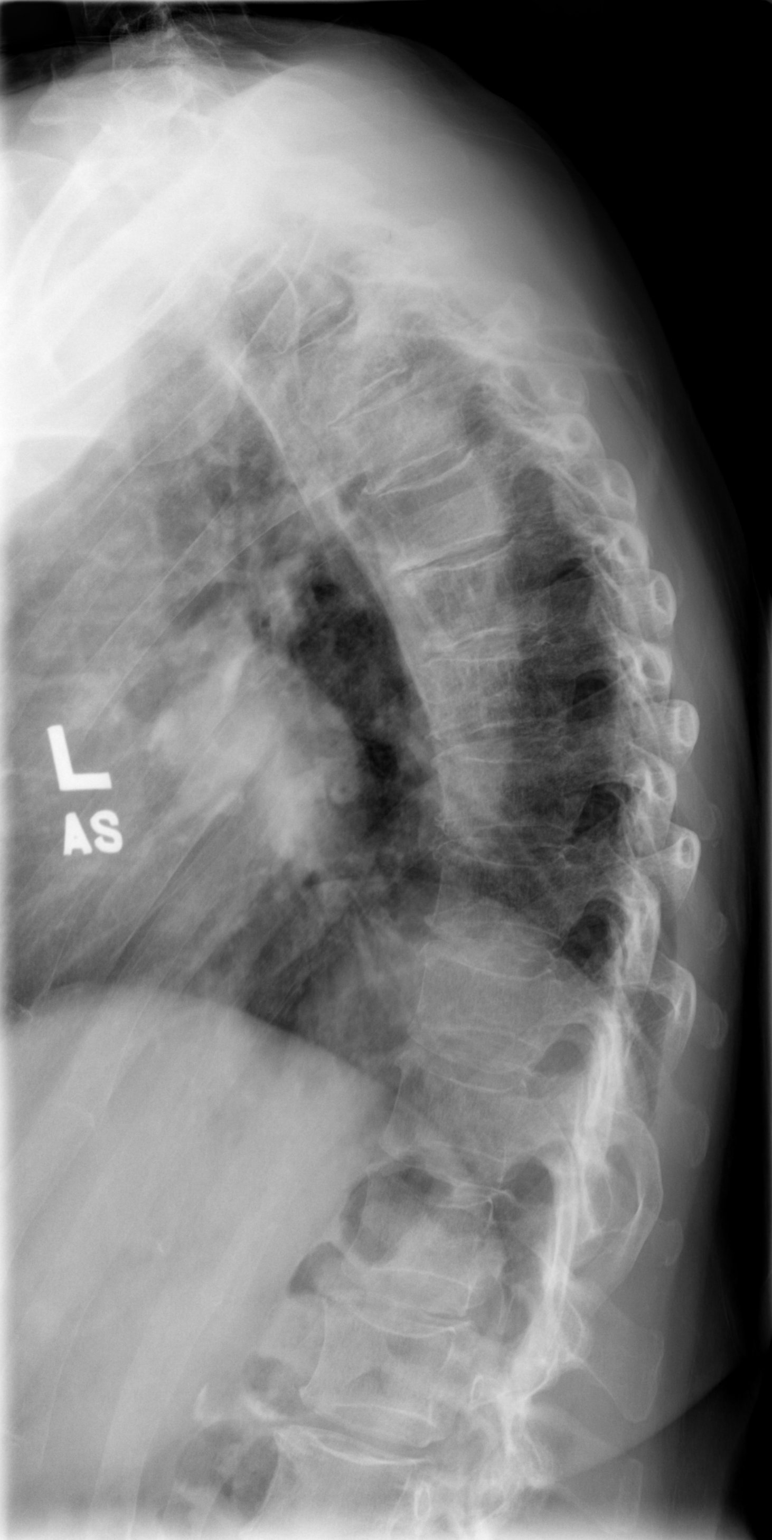

[t swimmers]
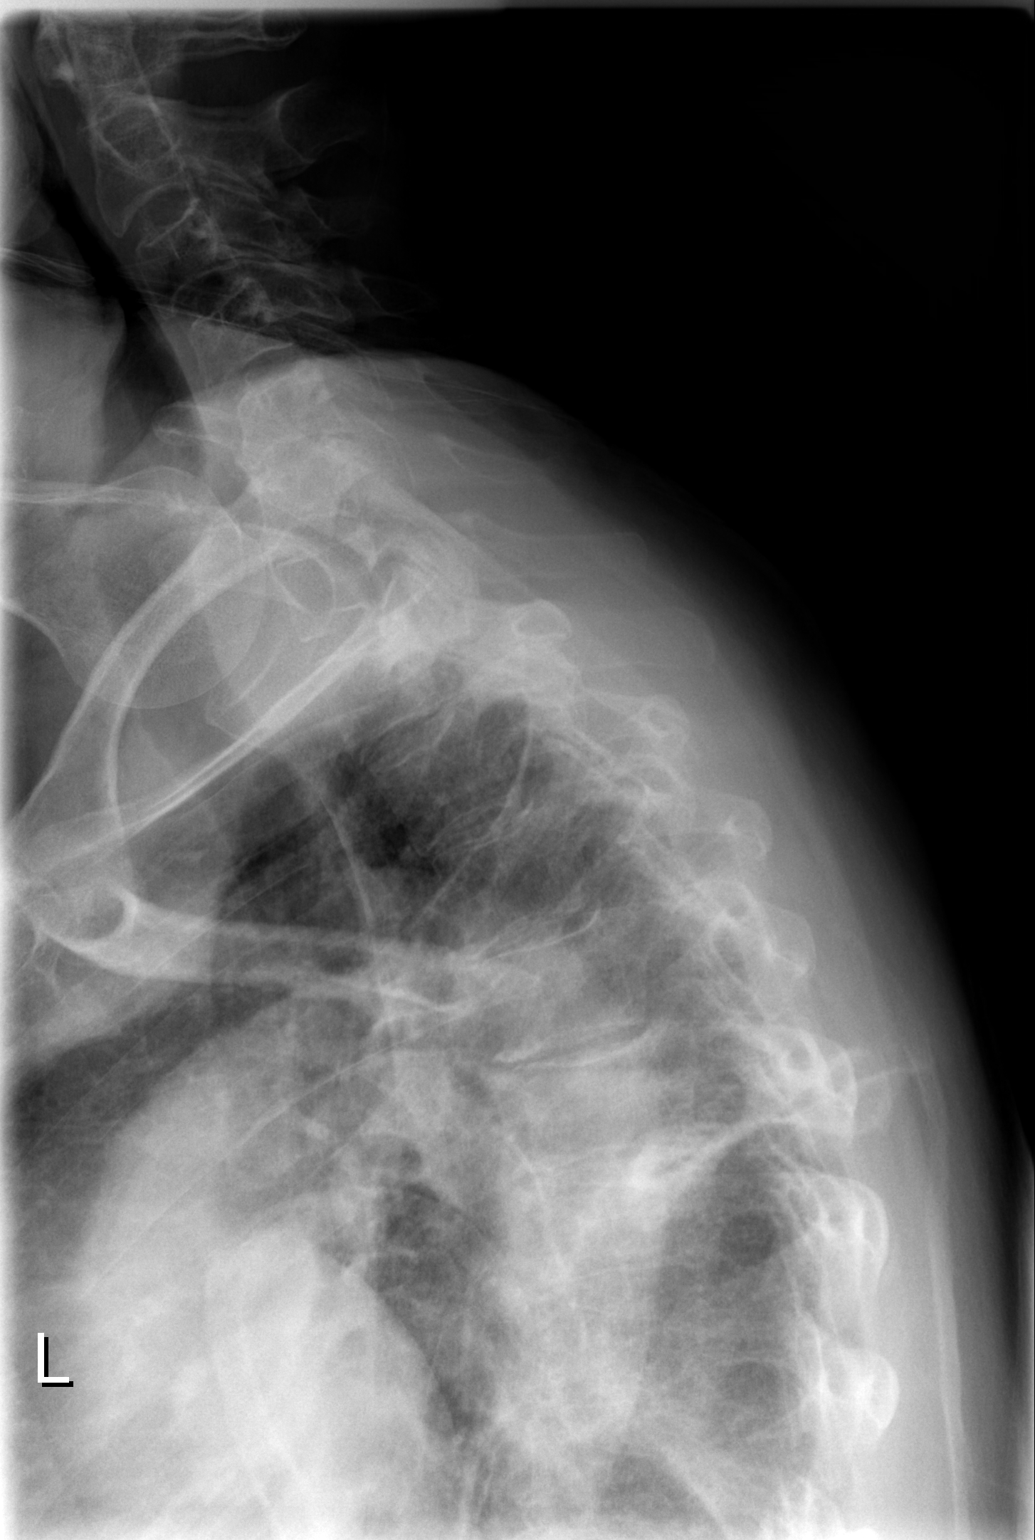

[3 of 3 positions shown; findings below may reference images not displayed]

FINDINGS: Twelve pairs of ribs, last pair hypoplastic.

Diffuse osseous demineralization.

Broad-based dextroconvex scoliosis.

Vertebral body heights maintained.

No acute fracture, subluxation, or bone destruction.
IMPRESSION: Osseous demineralization with minimal dextroconvex scoliosis
thoracic spine.

No acute abnormalities.

## 2018-04-29 DIAGNOSIS — M5134 Other intervertebral disc degeneration, thoracic region: Secondary | ICD-10-CM | POA: Diagnosis not present

## 2018-04-29 DIAGNOSIS — M9902 Segmental and somatic dysfunction of thoracic region: Secondary | ICD-10-CM | POA: Diagnosis not present

## 2018-04-29 DIAGNOSIS — M5431 Sciatica, right side: Secondary | ICD-10-CM | POA: Diagnosis not present

## 2018-04-29 DIAGNOSIS — M9903 Segmental and somatic dysfunction of lumbar region: Secondary | ICD-10-CM | POA: Diagnosis not present

## 2018-04-29 DIAGNOSIS — M9905 Segmental and somatic dysfunction of pelvic region: Secondary | ICD-10-CM | POA: Diagnosis not present

## 2018-04-29 DIAGNOSIS — M5137 Other intervertebral disc degeneration, lumbosacral region: Secondary | ICD-10-CM | POA: Diagnosis not present

## 2018-05-18 DIAGNOSIS — Z1389 Encounter for screening for other disorder: Secondary | ICD-10-CM | POA: Diagnosis not present

## 2018-05-18 DIAGNOSIS — Z79899 Other long term (current) drug therapy: Secondary | ICD-10-CM | POA: Diagnosis not present

## 2018-05-18 DIAGNOSIS — F325 Major depressive disorder, single episode, in full remission: Secondary | ICD-10-CM | POA: Diagnosis not present

## 2018-05-18 DIAGNOSIS — Z0000 Encounter for general adult medical examination without abnormal findings: Secondary | ICD-10-CM | POA: Diagnosis not present

## 2018-05-18 DIAGNOSIS — R5383 Other fatigue: Secondary | ICD-10-CM | POA: Diagnosis not present

## 2018-05-18 DIAGNOSIS — E039 Hypothyroidism, unspecified: Secondary | ICD-10-CM | POA: Diagnosis not present

## 2018-05-29 ENCOUNTER — Other Ambulatory Visit: Payer: Self-pay | Admitting: Obstetrics and Gynecology

## 2018-05-29 DIAGNOSIS — Z1231 Encounter for screening mammogram for malignant neoplasm of breast: Secondary | ICD-10-CM

## 2018-06-22 ENCOUNTER — Ambulatory Visit: Payer: Medicare HMO

## 2018-06-22 DIAGNOSIS — M9903 Segmental and somatic dysfunction of lumbar region: Secondary | ICD-10-CM | POA: Diagnosis not present

## 2018-06-22 DIAGNOSIS — M5137 Other intervertebral disc degeneration, lumbosacral region: Secondary | ICD-10-CM | POA: Diagnosis not present

## 2018-06-22 DIAGNOSIS — M5430 Sciatica, unspecified side: Secondary | ICD-10-CM | POA: Diagnosis not present

## 2018-06-24 DIAGNOSIS — M5430 Sciatica, unspecified side: Secondary | ICD-10-CM | POA: Diagnosis not present

## 2018-06-24 DIAGNOSIS — M5137 Other intervertebral disc degeneration, lumbosacral region: Secondary | ICD-10-CM | POA: Diagnosis not present

## 2018-06-24 DIAGNOSIS — M9903 Segmental and somatic dysfunction of lumbar region: Secondary | ICD-10-CM | POA: Diagnosis not present

## 2018-06-26 DIAGNOSIS — M9903 Segmental and somatic dysfunction of lumbar region: Secondary | ICD-10-CM | POA: Diagnosis not present

## 2018-06-26 DIAGNOSIS — M5430 Sciatica, unspecified side: Secondary | ICD-10-CM | POA: Diagnosis not present

## 2018-06-26 DIAGNOSIS — M5137 Other intervertebral disc degeneration, lumbosacral region: Secondary | ICD-10-CM | POA: Diagnosis not present

## 2018-06-29 DIAGNOSIS — M5430 Sciatica, unspecified side: Secondary | ICD-10-CM | POA: Diagnosis not present

## 2018-06-29 DIAGNOSIS — M9903 Segmental and somatic dysfunction of lumbar region: Secondary | ICD-10-CM | POA: Diagnosis not present

## 2018-06-29 DIAGNOSIS — M5137 Other intervertebral disc degeneration, lumbosacral region: Secondary | ICD-10-CM | POA: Diagnosis not present

## 2018-07-01 DIAGNOSIS — M9903 Segmental and somatic dysfunction of lumbar region: Secondary | ICD-10-CM | POA: Diagnosis not present

## 2018-07-01 DIAGNOSIS — N814 Uterovaginal prolapse, unspecified: Secondary | ICD-10-CM | POA: Diagnosis not present

## 2018-07-01 DIAGNOSIS — N8112 Cystocele, lateral: Secondary | ICD-10-CM | POA: Diagnosis not present

## 2018-07-01 DIAGNOSIS — M5430 Sciatica, unspecified side: Secondary | ICD-10-CM | POA: Diagnosis not present

## 2018-07-01 DIAGNOSIS — N812 Incomplete uterovaginal prolapse: Secondary | ICD-10-CM | POA: Diagnosis not present

## 2018-07-01 DIAGNOSIS — N815 Vaginal enterocele: Secondary | ICD-10-CM | POA: Diagnosis not present

## 2018-07-01 DIAGNOSIS — M5137 Other intervertebral disc degeneration, lumbosacral region: Secondary | ICD-10-CM | POA: Diagnosis not present

## 2018-07-03 DIAGNOSIS — M5137 Other intervertebral disc degeneration, lumbosacral region: Secondary | ICD-10-CM | POA: Diagnosis not present

## 2018-07-03 DIAGNOSIS — M5430 Sciatica, unspecified side: Secondary | ICD-10-CM | POA: Diagnosis not present

## 2018-07-03 DIAGNOSIS — M9903 Segmental and somatic dysfunction of lumbar region: Secondary | ICD-10-CM | POA: Diagnosis not present

## 2018-07-06 DIAGNOSIS — M5430 Sciatica, unspecified side: Secondary | ICD-10-CM | POA: Diagnosis not present

## 2018-07-06 DIAGNOSIS — M5137 Other intervertebral disc degeneration, lumbosacral region: Secondary | ICD-10-CM | POA: Diagnosis not present

## 2018-07-06 DIAGNOSIS — M9903 Segmental and somatic dysfunction of lumbar region: Secondary | ICD-10-CM | POA: Diagnosis not present

## 2018-07-10 DIAGNOSIS — M9903 Segmental and somatic dysfunction of lumbar region: Secondary | ICD-10-CM | POA: Diagnosis not present

## 2018-07-10 DIAGNOSIS — M5137 Other intervertebral disc degeneration, lumbosacral region: Secondary | ICD-10-CM | POA: Diagnosis not present

## 2018-07-10 DIAGNOSIS — M5430 Sciatica, unspecified side: Secondary | ICD-10-CM | POA: Diagnosis not present

## 2018-07-15 DIAGNOSIS — M5430 Sciatica, unspecified side: Secondary | ICD-10-CM | POA: Diagnosis not present

## 2018-07-15 DIAGNOSIS — M9903 Segmental and somatic dysfunction of lumbar region: Secondary | ICD-10-CM | POA: Diagnosis not present

## 2018-07-15 DIAGNOSIS — M5137 Other intervertebral disc degeneration, lumbosacral region: Secondary | ICD-10-CM | POA: Diagnosis not present

## 2018-07-22 DIAGNOSIS — M5137 Other intervertebral disc degeneration, lumbosacral region: Secondary | ICD-10-CM | POA: Diagnosis not present

## 2018-07-22 DIAGNOSIS — M9903 Segmental and somatic dysfunction of lumbar region: Secondary | ICD-10-CM | POA: Diagnosis not present

## 2018-07-22 DIAGNOSIS — M5430 Sciatica, unspecified side: Secondary | ICD-10-CM | POA: Diagnosis not present

## 2018-07-29 DIAGNOSIS — R3915 Urgency of urination: Secondary | ICD-10-CM | POA: Diagnosis not present

## 2018-07-29 DIAGNOSIS — N952 Postmenopausal atrophic vaginitis: Secondary | ICD-10-CM | POA: Diagnosis not present

## 2018-07-29 DIAGNOSIS — N390 Urinary tract infection, site not specified: Secondary | ICD-10-CM | POA: Diagnosis not present

## 2018-07-29 DIAGNOSIS — N8111 Cystocele, midline: Secondary | ICD-10-CM | POA: Diagnosis not present

## 2018-08-05 DIAGNOSIS — M5137 Other intervertebral disc degeneration, lumbosacral region: Secondary | ICD-10-CM | POA: Diagnosis not present

## 2018-08-05 DIAGNOSIS — M9903 Segmental and somatic dysfunction of lumbar region: Secondary | ICD-10-CM | POA: Diagnosis not present

## 2018-08-05 DIAGNOSIS — M5430 Sciatica, unspecified side: Secondary | ICD-10-CM | POA: Diagnosis not present

## 2018-09-24 ENCOUNTER — Ambulatory Visit
Admission: RE | Admit: 2018-09-24 | Discharge: 2018-09-24 | Disposition: A | Discharge status: Home / Self Care | Payer: Medicare Other | Source: Ambulatory Visit | Attending: Obstetrics and Gynecology | Admitting: Obstetrics and Gynecology

## 2018-09-24 DIAGNOSIS — E039 Hypothyroidism, unspecified: Secondary | ICD-10-CM | POA: Diagnosis not present

## 2018-09-24 DIAGNOSIS — Z1231 Encounter for screening mammogram for malignant neoplasm of breast: Secondary | ICD-10-CM

## 2018-09-30 DIAGNOSIS — L409 Psoriasis, unspecified: Secondary | ICD-10-CM | POA: Diagnosis not present

## 2018-09-30 DIAGNOSIS — L720 Epidermal cyst: Secondary | ICD-10-CM | POA: Diagnosis not present

## 2018-09-30 DIAGNOSIS — L570 Actinic keratosis: Secondary | ICD-10-CM | POA: Diagnosis not present

## 2018-09-30 DIAGNOSIS — Z23 Encounter for immunization: Secondary | ICD-10-CM | POA: Diagnosis not present

## 2018-10-20 DIAGNOSIS — J3089 Other allergic rhinitis: Secondary | ICD-10-CM | POA: Diagnosis not present

## 2018-10-21 DIAGNOSIS — M6289 Other specified disorders of muscle: Secondary | ICD-10-CM | POA: Diagnosis not present

## 2018-10-22 DIAGNOSIS — J3089 Other allergic rhinitis: Secondary | ICD-10-CM | POA: Diagnosis not present

## 2018-10-30 DIAGNOSIS — J3089 Other allergic rhinitis: Secondary | ICD-10-CM | POA: Diagnosis not present

## 2018-11-04 DIAGNOSIS — J3089 Other allergic rhinitis: Secondary | ICD-10-CM | POA: Diagnosis not present

## 2018-11-09 DIAGNOSIS — M6289 Other specified disorders of muscle: Secondary | ICD-10-CM | POA: Diagnosis not present

## 2018-11-11 DIAGNOSIS — J3089 Other allergic rhinitis: Secondary | ICD-10-CM | POA: Diagnosis not present

## 2018-11-17 DIAGNOSIS — J3089 Other allergic rhinitis: Secondary | ICD-10-CM | POA: Diagnosis not present

## 2018-11-17 DIAGNOSIS — M6289 Other specified disorders of muscle: Secondary | ICD-10-CM | POA: Diagnosis not present

## 2018-11-20 DIAGNOSIS — M6289 Other specified disorders of muscle: Secondary | ICD-10-CM | POA: Diagnosis not present

## 2018-11-24 DIAGNOSIS — M6289 Other specified disorders of muscle: Secondary | ICD-10-CM | POA: Diagnosis not present

## 2018-11-26 DIAGNOSIS — M6289 Other specified disorders of muscle: Secondary | ICD-10-CM | POA: Diagnosis not present

## 2018-12-15 DIAGNOSIS — M6289 Other specified disorders of muscle: Secondary | ICD-10-CM | POA: Diagnosis not present

## 2018-12-18 DIAGNOSIS — J3089 Other allergic rhinitis: Secondary | ICD-10-CM | POA: Diagnosis not present

## 2018-12-21 DIAGNOSIS — M6289 Other specified disorders of muscle: Secondary | ICD-10-CM | POA: Diagnosis not present

## 2018-12-23 DIAGNOSIS — M6289 Other specified disorders of muscle: Secondary | ICD-10-CM | POA: Diagnosis not present

## 2018-12-24 DIAGNOSIS — J111 Influenza due to unidentified influenza virus with other respiratory manifestations: Secondary | ICD-10-CM | POA: Diagnosis not present

## 2018-12-24 DIAGNOSIS — J1189 Influenza due to unidentified influenza virus with other manifestations: Secondary | ICD-10-CM | POA: Diagnosis not present

## 2018-12-29 DIAGNOSIS — J209 Acute bronchitis, unspecified: Secondary | ICD-10-CM | POA: Diagnosis not present

## 2018-12-29 DIAGNOSIS — J3089 Other allergic rhinitis: Secondary | ICD-10-CM | POA: Diagnosis not present

## 2019-01-05 DIAGNOSIS — M6289 Other specified disorders of muscle: Secondary | ICD-10-CM | POA: Diagnosis not present

## 2019-01-07 DIAGNOSIS — M6289 Other specified disorders of muscle: Secondary | ICD-10-CM | POA: Diagnosis not present

## 2019-01-11 DIAGNOSIS — M6289 Other specified disorders of muscle: Secondary | ICD-10-CM | POA: Diagnosis not present

## 2019-01-15 DIAGNOSIS — M6289 Other specified disorders of muscle: Secondary | ICD-10-CM | POA: Diagnosis not present

## 2019-01-19 DIAGNOSIS — J209 Acute bronchitis, unspecified: Secondary | ICD-10-CM | POA: Diagnosis not present

## 2019-01-19 DIAGNOSIS — J3089 Other allergic rhinitis: Secondary | ICD-10-CM | POA: Diagnosis not present

## 2019-01-21 DIAGNOSIS — M6289 Other specified disorders of muscle: Secondary | ICD-10-CM | POA: Diagnosis not present

## 2019-01-22 DIAGNOSIS — M9903 Segmental and somatic dysfunction of lumbar region: Secondary | ICD-10-CM | POA: Diagnosis not present

## 2019-01-22 DIAGNOSIS — M5430 Sciatica, unspecified side: Secondary | ICD-10-CM | POA: Diagnosis not present

## 2019-01-22 DIAGNOSIS — M5137 Other intervertebral disc degeneration, lumbosacral region: Secondary | ICD-10-CM | POA: Diagnosis not present

## 2019-01-26 DIAGNOSIS — M5137 Other intervertebral disc degeneration, lumbosacral region: Secondary | ICD-10-CM | POA: Diagnosis not present

## 2019-01-26 DIAGNOSIS — M5430 Sciatica, unspecified side: Secondary | ICD-10-CM | POA: Diagnosis not present

## 2019-01-26 DIAGNOSIS — M9903 Segmental and somatic dysfunction of lumbar region: Secondary | ICD-10-CM | POA: Diagnosis not present

## 2019-01-28 DIAGNOSIS — N952 Postmenopausal atrophic vaginitis: Secondary | ICD-10-CM | POA: Diagnosis not present

## 2019-01-28 DIAGNOSIS — N8111 Cystocele, midline: Secondary | ICD-10-CM | POA: Diagnosis not present

## 2019-01-29 DIAGNOSIS — J3089 Other allergic rhinitis: Secondary | ICD-10-CM | POA: Diagnosis not present

## 2019-02-01 DIAGNOSIS — M5430 Sciatica, unspecified side: Secondary | ICD-10-CM | POA: Diagnosis not present

## 2019-02-01 DIAGNOSIS — M9903 Segmental and somatic dysfunction of lumbar region: Secondary | ICD-10-CM | POA: Diagnosis not present

## 2019-02-01 DIAGNOSIS — M5137 Other intervertebral disc degeneration, lumbosacral region: Secondary | ICD-10-CM | POA: Diagnosis not present

## 2019-02-02 DIAGNOSIS — J3089 Other allergic rhinitis: Secondary | ICD-10-CM | POA: Diagnosis not present

## 2019-02-05 DIAGNOSIS — M5430 Sciatica, unspecified side: Secondary | ICD-10-CM | POA: Diagnosis not present

## 2019-02-05 DIAGNOSIS — M5137 Other intervertebral disc degeneration, lumbosacral region: Secondary | ICD-10-CM | POA: Diagnosis not present

## 2019-02-05 DIAGNOSIS — M9903 Segmental and somatic dysfunction of lumbar region: Secondary | ICD-10-CM | POA: Diagnosis not present

## 2019-02-10 DIAGNOSIS — J3089 Other allergic rhinitis: Secondary | ICD-10-CM | POA: Diagnosis not present

## 2019-02-11 ENCOUNTER — Other Ambulatory Visit (HOSPITAL_COMMUNITY)
Admission: RE | Admit: 2019-02-11 | Discharge: 2019-02-11 | Disposition: A | Discharge status: Home / Self Care | Payer: Medicare Other | Source: Ambulatory Visit | Attending: Obstetrics and Gynecology | Admitting: Obstetrics and Gynecology

## 2019-02-11 ENCOUNTER — Other Ambulatory Visit: Payer: Self-pay | Admitting: Obstetrics and Gynecology

## 2019-02-11 DIAGNOSIS — N644 Mastodynia: Secondary | ICD-10-CM | POA: Diagnosis not present

## 2019-02-11 DIAGNOSIS — N819 Female genital prolapse, unspecified: Secondary | ICD-10-CM | POA: Diagnosis not present

## 2019-02-11 DIAGNOSIS — Z01419 Encounter for gynecological examination (general) (routine) without abnormal findings: Secondary | ICD-10-CM | POA: Diagnosis not present

## 2019-02-11 DIAGNOSIS — N763 Subacute and chronic vulvitis: Secondary | ICD-10-CM | POA: Diagnosis not present

## 2019-02-11 DIAGNOSIS — E039 Hypothyroidism, unspecified: Secondary | ICD-10-CM | POA: Diagnosis not present

## 2019-02-12 LAB — CYTOLOGY - PAP
Diagnosis: NEGATIVE
HPV: NOT DETECTED

## 2019-02-16 DIAGNOSIS — M6289 Other specified disorders of muscle: Secondary | ICD-10-CM | POA: Diagnosis not present

## 2019-02-17 DIAGNOSIS — J3089 Other allergic rhinitis: Secondary | ICD-10-CM | POA: Diagnosis not present

## 2019-02-24 DIAGNOSIS — M6289 Other specified disorders of muscle: Secondary | ICD-10-CM | POA: Diagnosis not present

## 2019-03-02 DIAGNOSIS — L300 Nummular dermatitis: Secondary | ICD-10-CM | POA: Diagnosis not present

## 2019-03-02 DIAGNOSIS — L821 Other seborrheic keratosis: Secondary | ICD-10-CM | POA: Diagnosis not present

## 2019-03-02 DIAGNOSIS — B070 Plantar wart: Secondary | ICD-10-CM | POA: Diagnosis not present

## 2019-03-02 DIAGNOSIS — L853 Xerosis cutis: Secondary | ICD-10-CM | POA: Diagnosis not present

## 2019-03-02 DIAGNOSIS — L570 Actinic keratosis: Secondary | ICD-10-CM | POA: Diagnosis not present

## 2019-03-02 DIAGNOSIS — D1801 Hemangioma of skin and subcutaneous tissue: Secondary | ICD-10-CM | POA: Diagnosis not present

## 2019-03-03 DIAGNOSIS — N644 Mastodynia: Secondary | ICD-10-CM | POA: Diagnosis not present

## 2020-06-21 ENCOUNTER — Other Ambulatory Visit: Payer: Self-pay
